# Patient Record
Sex: Female | Born: 1940
Health system: Southern US, Community
[De-identification: ages and names within clinical notes are randomized; demographics above are authoritative.]

## PROBLEM LIST (undated history)

## (undated) DIAGNOSIS — H409 Unspecified glaucoma: Secondary | ICD-10-CM

## (undated) DIAGNOSIS — I471 Supraventricular tachycardia, unspecified: Secondary | ICD-10-CM

## (undated) DIAGNOSIS — M419 Scoliosis, unspecified: Secondary | ICD-10-CM

## (undated) DIAGNOSIS — Z8719 Personal history of other diseases of the digestive system: Secondary | ICD-10-CM

## (undated) DIAGNOSIS — R928 Other abnormal and inconclusive findings on diagnostic imaging of breast: Secondary | ICD-10-CM

## (undated) DIAGNOSIS — K219 Gastro-esophageal reflux disease without esophagitis: Secondary | ICD-10-CM

## (undated) DIAGNOSIS — E785 Hyperlipidemia, unspecified: Secondary | ICD-10-CM

## (undated) DIAGNOSIS — E538 Deficiency of other specified B group vitamins: Secondary | ICD-10-CM

## (undated) DIAGNOSIS — L570 Actinic keratosis: Secondary | ICD-10-CM

## (undated) DIAGNOSIS — D259 Leiomyoma of uterus, unspecified: Secondary | ICD-10-CM

## (undated) DIAGNOSIS — J309 Allergic rhinitis, unspecified: Secondary | ICD-10-CM

## (undated) DIAGNOSIS — I2510 Atherosclerotic heart disease of native coronary artery without angina pectoris: Secondary | ICD-10-CM

## (undated) DIAGNOSIS — R7303 Prediabetes: Secondary | ICD-10-CM

## (undated) DIAGNOSIS — I10 Essential (primary) hypertension: Secondary | ICD-10-CM

## (undated) DIAGNOSIS — K5790 Diverticulosis of intestine, part unspecified, without perforation or abscess without bleeding: Secondary | ICD-10-CM

## (undated) DIAGNOSIS — R7989 Other specified abnormal findings of blood chemistry: Secondary | ICD-10-CM

## (undated) DIAGNOSIS — I499 Cardiac arrhythmia, unspecified: Secondary | ICD-10-CM

## (undated) DIAGNOSIS — E039 Hypothyroidism, unspecified: Secondary | ICD-10-CM

## (undated) DIAGNOSIS — E669 Obesity, unspecified: Secondary | ICD-10-CM

## (undated) DIAGNOSIS — M7660 Achilles tendinitis, unspecified leg: Secondary | ICD-10-CM

## (undated) DIAGNOSIS — Z9889 Other specified postprocedural states: Secondary | ICD-10-CM

## (undated) DIAGNOSIS — I700 Atherosclerosis of aorta: Secondary | ICD-10-CM

## (undated) DIAGNOSIS — I351 Nonrheumatic aortic (valve) insufficiency: Secondary | ICD-10-CM

## (undated) DIAGNOSIS — D649 Anemia, unspecified: Secondary | ICD-10-CM

## (undated) DIAGNOSIS — M1990 Unspecified osteoarthritis, unspecified site: Secondary | ICD-10-CM

## (undated) DIAGNOSIS — E0590 Thyrotoxicosis, unspecified without thyrotoxic crisis or storm: Secondary | ICD-10-CM

## (undated) DIAGNOSIS — K4021 Bilateral inguinal hernia, without obstruction or gangrene, recurrent: Secondary | ICD-10-CM

## (undated) DIAGNOSIS — I493 Ventricular premature depolarization: Secondary | ICD-10-CM

## (undated) HISTORY — PX: INGUINAL HERNIA REPAIR: SUR1180

## (undated) HISTORY — DX: Thyrotoxicosis, unspecified without thyrotoxic crisis or storm: E05.90

## (undated) HISTORY — DX: Gastro-esophageal reflux disease without esophagitis: K21.9

## (undated) HISTORY — DX: Essential (primary) hypertension: I10

## (undated) HISTORY — PX: VARICOSE VEIN SURGERY: SHX832

## (undated) HISTORY — DX: Hyperlipidemia, unspecified: E78.5

## (undated) HISTORY — DX: Unspecified osteoarthritis, unspecified site: M19.90

## (undated) HISTORY — DX: Actinic keratosis: L57.0

## (undated) HISTORY — PX: TUBAL LIGATION: SHX77

## (undated) HISTORY — DX: Other specified abnormal findings of blood chemistry: R79.89

## (undated) HISTORY — DX: Allergic rhinitis, unspecified: J30.9

## (undated) HISTORY — PX: COLONOSCOPY: SHX174

## (undated) HISTORY — DX: Unspecified glaucoma: H40.9

## (undated) HISTORY — DX: Obesity, unspecified: E66.9

## (undated) HISTORY — DX: Achilles tendinitis, unspecified leg: M76.60

## (undated) HISTORY — DX: Anemia, unspecified: D64.9

## (undated) HISTORY — PX: ESOPHAGOGASTRODUODENOSCOPY: SHX1529

## (undated) HISTORY — PX: LOBECTOMY: SHX5089

## (undated) HISTORY — PX: KNEE ARTHROSCOPY: SUR90

## (undated) HISTORY — DX: Other abnormal and inconclusive findings on diagnostic imaging of breast: R92.8

---

## 1989-09-03 HISTORY — PX: THYROIDECTOMY: SHX17

## 1999-04-22 ENCOUNTER — Ambulatory Visit (HOSPITAL_COMMUNITY): Admission: RE | Admit: 1999-04-22 | Discharge: 1999-04-22 | Payer: Self-pay | Admitting: Neurosurgery

## 1999-05-10 ENCOUNTER — Encounter: Admission: RE | Admit: 1999-05-10 | Discharge: 1999-05-15 | Payer: Self-pay | Admitting: Neurosurgery

## 1999-07-21 ENCOUNTER — Other Ambulatory Visit: Admission: RE | Admit: 1999-07-21 | Discharge: 1999-07-21 | Payer: Self-pay | Admitting: Obstetrics and Gynecology

## 1999-11-04 ENCOUNTER — Emergency Department (HOSPITAL_COMMUNITY): Admission: EM | Admit: 1999-11-04 | Discharge: 1999-11-04 | Payer: Self-pay | Admitting: Emergency Medicine

## 1999-11-05 ENCOUNTER — Encounter: Payer: Self-pay | Admitting: Emergency Medicine

## 2000-07-23 ENCOUNTER — Encounter: Payer: Self-pay | Admitting: Internal Medicine

## 2001-02-10 ENCOUNTER — Other Ambulatory Visit: Admission: RE | Admit: 2001-02-10 | Discharge: 2001-02-10 | Payer: Self-pay | Admitting: Obstetrics and Gynecology

## 2002-05-17 ENCOUNTER — Emergency Department (HOSPITAL_COMMUNITY): Admission: EM | Admit: 2002-05-17 | Discharge: 2002-05-17 | Payer: Self-pay | Admitting: Emergency Medicine

## 2002-05-28 ENCOUNTER — Other Ambulatory Visit: Admission: RE | Admit: 2002-05-28 | Discharge: 2002-05-28 | Payer: Self-pay | Admitting: Obstetrics and Gynecology

## 2003-09-20 ENCOUNTER — Other Ambulatory Visit: Admission: RE | Admit: 2003-09-20 | Discharge: 2003-09-20 | Payer: Self-pay | Admitting: Pediatric Nephrology

## 2004-10-12 ENCOUNTER — Other Ambulatory Visit: Admission: RE | Admit: 2004-10-12 | Discharge: 2004-10-12 | Payer: Self-pay | Admitting: Obstetrics and Gynecology

## 2005-03-30 ENCOUNTER — Observation Stay (HOSPITAL_COMMUNITY): Admission: RE | Admit: 2005-03-30 | Discharge: 2005-03-31 | Payer: Self-pay

## 2005-03-30 ENCOUNTER — Encounter (INDEPENDENT_AMBULATORY_CARE_PROVIDER_SITE_OTHER): Payer: Self-pay | Admitting: Specialist

## 2005-07-05 ENCOUNTER — Encounter: Payer: Self-pay | Admitting: Internal Medicine

## 2005-07-10 ENCOUNTER — Ambulatory Visit: Payer: Self-pay | Admitting: Internal Medicine

## 2005-07-24 ENCOUNTER — Ambulatory Visit: Payer: Self-pay | Admitting: Internal Medicine

## 2005-07-24 ENCOUNTER — Encounter (INDEPENDENT_AMBULATORY_CARE_PROVIDER_SITE_OTHER): Payer: Self-pay | Admitting: Specialist

## 2006-07-29 ENCOUNTER — Ambulatory Visit: Payer: Self-pay | Admitting: Internal Medicine

## 2006-08-30 ENCOUNTER — Ambulatory Visit: Payer: Self-pay | Admitting: Internal Medicine

## 2006-09-12 ENCOUNTER — Ambulatory Visit: Payer: Self-pay | Admitting: Internal Medicine

## 2006-09-12 ENCOUNTER — Encounter (INDEPENDENT_AMBULATORY_CARE_PROVIDER_SITE_OTHER): Payer: Self-pay | Admitting: *Deleted

## 2007-03-06 ENCOUNTER — Encounter: Payer: Self-pay | Admitting: Cardiovascular Disease

## 2007-07-23 ENCOUNTER — Other Ambulatory Visit: Admission: RE | Admit: 2007-07-23 | Discharge: 2007-07-23 | Payer: Self-pay | Admitting: Family Medicine

## 2010-05-24 ENCOUNTER — Encounter (INDEPENDENT_AMBULATORY_CARE_PROVIDER_SITE_OTHER): Payer: Self-pay | Admitting: *Deleted

## 2010-09-11 ENCOUNTER — Observation Stay (HOSPITAL_COMMUNITY)
Admission: EM | Admit: 2010-09-11 | Discharge: 2010-09-12 | Payer: Self-pay | Source: Home / Self Care | Attending: Internal Medicine | Admitting: Internal Medicine

## 2010-09-15 ENCOUNTER — Encounter: Payer: Self-pay | Admitting: Internal Medicine

## 2010-09-15 ENCOUNTER — Telehealth: Payer: Self-pay | Admitting: Internal Medicine

## 2010-09-18 ENCOUNTER — Encounter (INDEPENDENT_AMBULATORY_CARE_PROVIDER_SITE_OTHER): Payer: Self-pay | Admitting: *Deleted

## 2010-09-18 LAB — CK TOTAL AND CKMB (NOT AT ARMC)
CK, MB: 0.7 ng/mL (ref 0.3–4.0)
Relative Index: INVALID (ref 0.0–2.5)
Total CK: 38 U/L (ref 7–177)

## 2010-09-18 LAB — POCT I-STAT, CHEM 8
BUN: 24 mg/dL — ABNORMAL HIGH (ref 6–23)
Calcium, Ion: 0.9 mmol/L — ABNORMAL LOW (ref 1.12–1.32)
Chloride: 107 mEq/L (ref 96–112)
Creatinine, Ser: 0.8 mg/dL (ref 0.4–1.2)
Glucose, Bld: 104 mg/dL — ABNORMAL HIGH (ref 70–99)
HCT: 43 % (ref 36.0–46.0)
Hemoglobin: 14.6 g/dL (ref 12.0–15.0)
Potassium: 7.2 mEq/L (ref 3.5–5.1)
Sodium: 134 mEq/L — ABNORMAL LOW (ref 135–145)
TCO2: 29 mmol/L (ref 0–100)

## 2010-09-18 LAB — CARDIAC PANEL(CRET KIN+CKTOT+MB+TROPI)
CK, MB: 0.7 ng/mL (ref 0.3–4.0)
CK, MB: 0.7 ng/mL (ref 0.3–4.0)
Relative Index: INVALID (ref 0.0–2.5)
Relative Index: INVALID (ref 0.0–2.5)
Total CK: 29 U/L (ref 7–177)
Total CK: 34 U/L (ref 7–177)
Troponin I: 0.01 ng/mL (ref 0.00–0.06)
Troponin I: 0.01 ng/mL (ref 0.00–0.06)

## 2010-09-18 LAB — DIFFERENTIAL
Basophils Absolute: 0 10*3/uL (ref 0.0–0.1)
Basophils Relative: 0 % (ref 0–1)
Eosinophils Absolute: 0 10*3/uL (ref 0.0–0.7)
Eosinophils Relative: 0 % (ref 0–5)
Lymphocytes Relative: 12 % (ref 12–46)
Lymphs Abs: 1.3 10*3/uL (ref 0.7–4.0)
Monocytes Absolute: 0.7 10*3/uL (ref 0.1–1.0)
Monocytes Relative: 6 % (ref 3–12)
Neutro Abs: 9.1 10*3/uL — ABNORMAL HIGH (ref 1.7–7.7)
Neutrophils Relative %: 83 % — ABNORMAL HIGH (ref 43–77)

## 2010-09-18 LAB — URINALYSIS, ROUTINE W REFLEX MICROSCOPIC
Bilirubin Urine: NEGATIVE
Hgb urine dipstick: NEGATIVE
Ketones, ur: NEGATIVE mg/dL
Nitrite: NEGATIVE
Protein, ur: NEGATIVE mg/dL
Specific Gravity, Urine: 1.013 (ref 1.005–1.030)
Urine Glucose, Fasting: NEGATIVE mg/dL
Urobilinogen, UA: 0.2 mg/dL (ref 0.0–1.0)
pH: 6 (ref 5.0–8.0)

## 2010-09-18 LAB — CBC
HCT: 41.3 % (ref 36.0–46.0)
Hemoglobin: 13.6 g/dL (ref 12.0–15.0)
MCH: 28.3 pg (ref 26.0–34.0)
MCHC: 32.9 g/dL (ref 30.0–36.0)
MCV: 85.9 fL (ref 78.0–100.0)
Platelets: 363 10*3/uL (ref 150–400)
RBC: 4.81 MIL/uL (ref 3.87–5.11)
RDW: 16.2 % — ABNORMAL HIGH (ref 11.5–15.5)
WBC: 11.1 10*3/uL — ABNORMAL HIGH (ref 4.0–10.5)

## 2010-09-18 LAB — POCT CARDIAC MARKERS
CKMB, poc: <1 ng/mL — ABNORMAL LOW (ref 1.0–8.0)
CKMB, poc: <1 ng/mL — ABNORMAL LOW (ref 1.0–8.0)
Myoglobin, poc: 67.3 ng/mL (ref 12–200)
Myoglobin, poc: 38.4 ng/mL (ref 12–200)
Troponin i, poc: <0.05 ng/mL (ref 0.00–0.09)
Troponin i, poc: <0.05 ng/mL (ref 0.00–0.09)

## 2010-09-18 LAB — BASIC METABOLIC PANEL
BUN: 18 mg/dL (ref 6–23)
CO2: 27 mEq/L (ref 19–32)
Calcium: 8.7 mg/dL (ref 8.4–10.5)
Chloride: 104 mEq/L (ref 96–112)
Creatinine, Ser: 0.84 mg/dL (ref 0.4–1.2)
GFR calc Af Amer: >60 mL/min (ref >60)
GFR calc non Af Amer: >60 mL/min (ref >60)
Glucose, Bld: 172 mg/dL — ABNORMAL HIGH (ref 70–99)
Potassium: 3.9 mEq/L (ref 3.5–5.1)
Sodium: 140 mEq/L (ref 135–145)

## 2010-09-18 LAB — HEMOGLOBIN A1C
Hgb A1c MFr Bld: 6.5 % — ABNORMAL HIGH (ref <5.7)
Mean Plasma Glucose: 140 mg/dL — ABNORMAL HIGH (ref <117)

## 2010-09-18 LAB — TROPONIN I: Troponin I: 0.01 ng/mL (ref 0.00–0.06)

## 2010-09-18 LAB — TSH: TSH: 3.702 u[IU]/mL (ref 0.350–4.500)

## 2010-09-18 LAB — POTASSIUM: Potassium: 3.8 mEq/L (ref 3.5–5.1)

## 2010-09-19 ENCOUNTER — Ambulatory Visit
Admission: RE | Admit: 2010-09-19 | Discharge: 2010-09-19 | Payer: Self-pay | Source: Home / Self Care | Attending: Internal Medicine | Admitting: Internal Medicine

## 2010-09-20 ENCOUNTER — Telehealth (INDEPENDENT_AMBULATORY_CARE_PROVIDER_SITE_OTHER): Payer: Self-pay | Admitting: Radiology

## 2010-09-21 ENCOUNTER — Encounter: Payer: Self-pay | Admitting: Internal Medicine

## 2010-09-21 ENCOUNTER — Ambulatory Visit: Admission: RE | Admit: 2010-09-21 | Discharge: 2010-09-21 | Payer: Self-pay | Source: Home / Self Care

## 2010-09-21 ENCOUNTER — Encounter (HOSPITAL_COMMUNITY)
Admission: RE | Admit: 2010-09-21 | Discharge: 2010-10-03 | Payer: Self-pay | Source: Home / Self Care | Attending: Cardiovascular Disease | Admitting: Cardiovascular Disease

## 2010-09-25 ENCOUNTER — Telehealth (INDEPENDENT_AMBULATORY_CARE_PROVIDER_SITE_OTHER): Payer: Self-pay | Admitting: *Deleted

## 2010-09-26 ENCOUNTER — Ambulatory Visit: Admit: 2010-09-26 | Payer: Self-pay | Admitting: Internal Medicine

## 2010-09-26 ENCOUNTER — Other Ambulatory Visit: Payer: Self-pay | Admitting: Internal Medicine

## 2010-09-26 ENCOUNTER — Ambulatory Visit
Admission: RE | Admit: 2010-09-26 | Discharge: 2010-09-26 | Payer: Self-pay | Source: Home / Self Care | Attending: Internal Medicine | Admitting: Internal Medicine

## 2010-09-28 NOTE — Discharge Summary (Addendum)
  [name], Gina Ford                ACCOUNT NO.:  [id number]  MEDICAL RECORD NO.:  [id number]          PATIENT TYPE:  OBV  LOCATION:  1444                         FACILITY:  [facility]  PHYSICIAN:  Hind I Elsaid, MD      DATE OF BIRTH:  September 23, 1940  DATE OF ADMISSION:  09/11/2010 DATE OF DISCHARGE:  09/12/2010                              DISCHARGE SUMMARY   PRIMARY CARE PHYSICIAN:  Sigmund Hazel, MD  CARDIOLOGIST:  Vesta Mixer, MD  DISCHARGE DIAGNOSES: 1. Chest pain, myocardial infarction was ruled out for myocardial     stress test as an outpatient. 2. Hypothyroidism. 3. Hypertension. 4. Hyperlipidemia. 5. Arthritis. 6. History of gastroesophageal reflux disease.  PROCEDURES: 1. Chest x-ray, mild chronic bronchitic-type change, but no acute     pulmonary findings. 2. EKG, normal sinus rhythm, possible left atrial enlargement.  HISTORY OF PRESENT ILLNESS:  This is a 70 year old very pleasant female with a history of hypothyroidism and arthritis.  The patient is really active lady.  She will go to the gym almost more than 3 times weekly. She started recently participating on heavy activities.  She presented to the hospital with chest pain and EKG did show normal sinus rhythm. The patient denies any shortness of breath.  The patient was recently started on meloxicam, prednisone, second anti-inflammatory condition over the sole of her feet.  The patient admitted to the hospital for further evaluation of her chest pain.  Cardiac enzymes x3 were negative and EKG did show normal sinus rhythm.  Cardiology consult done by Dr. Kristeen Miss, where the patient was scheduled to have outpatient stress test and followup with them.  The patient on the day of discharge, denies any chest pain, denies any shortness of breath.  The patient was advised to follow up with her primary care physician.  Currently, we felt the patient is medially stable for discharge.  The patient will need to  follow up with her primary care as well as appointment for her stress test.     Hind Bosie Helper, MD     HIE/MEDQ  D:  09/12/2010  T:  09/13/2010  Job:  [id number]  Electronically Signed by Ebony Cargo MD on 09/28/2010 12:44:02 PM

## 2010-10-03 ENCOUNTER — Encounter: Payer: Self-pay | Admitting: Internal Medicine

## 2010-10-03 NOTE — Letter (Signed)
Summary: Colonoscopy Letter  [facility]  [address] [place], [place] [address]   Phone: [phone]  Fax: [phone]      May 24, 2010 MRN: [id number]   [facility] [address] [place], [place]  [address]   Dear Gina Ford,   According to your medical record, it is time for you to schedule a Colonoscopy. The American Cancer Society recommends this procedure as a method to detect early colon cancer. Patients with a family history of colon cancer, or a personal history of colon polyps or inflammatory bowel disease are at increased risk.  This letter has beeen generated based on the recommendations made at the time of your procedure. If you feel that in your particular situation this may no longer apply, please contact our office.  Please call our office at [phone] to schedule this appointment or to update your records at your earliest convenience.  Thank you for cooperating with [place] to provide you with the very best care possible.   Sincerely,  Gina Ford. Juanda Chance, M.D.  [facility] Gastroenterology Division [phone]

## 2010-10-05 NOTE — Progress Notes (Signed)
Summary: nuc pre-procedure  Phone Note Outgoing Call   Call placed by: Domenic Polite, CNMT,  September 20, 2010 11:00 AM Call placed to: Patient Reason for Call: Confirm/change Appt Summary of Call: Reviewed information on Myoview Information Sheet (see scanned document for further details).  Spoke with patient.      Nuclear Med Background Indications for Stress Test: Evaluation for Ischemia, Post Hospital     Symptoms: Chest Pain  Symptoms Comments: CP> "Heaviness" Lt. shoulder   Nuclear Pre-Procedure Cardiac Risk Factors: Family History - CAD, Hypertension, Lipids

## 2010-10-05 NOTE — Procedures (Addendum)
Summary: Colonoscopy  Patient: [name] Note: All result statuses are Final unless otherwise noted.  Tests: (1) Colonoscopy (COL)   COL Colonoscopy           DONE     [facility]     [address]     [place], [place]  [address]           COLONOSCOPY PROCEDURE REPORT           PATIENT:  Gina Ford, Gina Ford  MR#:  [id number]     BIRTHDATE:  1940-10-15, 69 yrs. old  GENDER:  female     ENDOSCOPIST:  Hedwig Morton. Juanda Chance, MD     REF. BY:  Sigmund Hazel, M.D.     PROCEDURE DATE:  09/26/2010     PROCEDURE:  Colonoscopy 21308     ASA CLASS:  Class II     INDICATIONS:  history of pre-cancerous (adenomatous) colon polyps     aden. polyp 1986 and 2006,     MEDICATIONS:   Versed 3 mg, Fentanyl 50 mcg           DESCRIPTION OF PROCEDURE:   After the risks benefits and     alternatives of the procedure were thoroughly explained, informed     consent was obtained.  Digital rectal exam was performed and     revealed no rectal masses.   The LB [id number]-[id number] endoscope     was introduced through the anus and advanced to the cecum, which     was identified by both the appendix and ileocecal valve, without     limitations.  The quality of the prep was good, using MoviPrep.     The instrument was then slowly withdrawn as the colon was fully     examined.     <<PROCEDUREIMAGES>>           FINDINGS:  Moderate diverticulosis was found (see image1, image2,     and image7). narrow left colon with deep diverticuli  A sessile     polyp was found. 3mm polyp at 20 cm The polyp was removed using     cold biopsy forceps (see image8).  This was otherwise a normal     examination of the colon (see image9, image6, image5, image4, and     image3).   Retroflexed views in the rectum revealed no     abnormalities.    The scope was then withdrawn from the patient     and the procedure completed.           COMPLICATIONS:  None     ENDOSCOPIC IMPRESSION:     1) Moderate diverticulosis     2) Sessile polyp     3) Otherwise normal examination     RECOMMENDATIONS:     1) Await pathology results     2) High fiber diet.     REPEAT EXAM:  In 10 year(s) for.           ______________________________     Hedwig Morton. Juanda Chance, MD           CC:           n.     eSIGNED:   Hedwig Morton. Ellaree Gear at 09/26/2010 02:24 PM           Abran Richard, [id number]  Note: An exclamation mark (!) indicates a result that was not dispersed into the flowsheet. Document Creation Date: 09/26/2010 2:24 PM _______________________________________________________________________  (1) Order result status: Final  Collection or observation date-time: 09/26/2010 14:10 Requested date-time:  Receipt date-time:  Reported date-time:  Referring Physician:   Ordering Physician: Lina Sar [phone]) Specimen Source:  Source: Launa Grill Order Number: [id number] Lab site:   Appended Document: Colonoscopy     Procedures Next Due Date:    Colonoscopy: 10/2020

## 2010-10-05 NOTE — Procedures (Signed)
Summary: EGD  [View Reports-CCC] Patient Name: Gina Ford, Gina Ford. MRN:  Procedure Procedures: Panendoscopy (EGD) CPT: 43235.    with biopsy(s)/brushing(s). CPT: D1846139.  Personnel: Endoscopist: Bernell Sigal L. Juanda Chance, MD.  Exam Location: Exam performed in Outpatient Clinic. Outpatient  Patient Consent: Procedure, Alternatives, Risks and Benefits discussed, consent obtained, from patient. Consent was obtained by the RN.  Indications Symptoms: Reflux symptoms for <1 yr,  History  Current Medications: Patient is not currently taking Coumadin.  Pre-Exam Physical: Performed Sep 12, 2006  Entire physical exam was normal.  Comments: Pt. history reviewed/updated, physical exam performed prior to initiation of sedation?yes Exam Exam Info: Maximum depth of insertion Duodenum, intended Duodenum. Vocal cords visualized. Gastric retroflexion performed. Images taken. ASA Classification: I. Tolerance: good.  Sedation Meds: Patient assessed and found to be appropriate for moderate (conscious) sedation. Fentanyl 50 mcg. given IV. Versed 5 mg. given IV. Cetacaine Spray 2 sprays given aerosolized.  Monitoring: BP and pulse monitoring done. Oximetry used. Supplemental O2 given  Findings - HIATAL HERNIA: Diaphragm 39 cm from mouth. Z-line/GE Junction 35 cm from mouth. Regular, 4 cms. in length. Biopsy/Hiatal Hernia taken.  ICD9: Hernia, Hiatal: 553.3.Comments: 3-4 cm nonreducible hiatal hernia, s/p biopsies from the ge junction.  DIAGNOSTIC TEST: from Antrum. RUT done, results pending  Normal: Duodenal Apex.   Assessment Abnormal examination, see findings above.  Diagnoses: 553.3: Hernia, Hiatal.   Comments: moderate size hiatal hernia without stricture or esophagitis, s/p biopsies from the g-e junction Events  Unplanned Intervention: No unplanned interventions were required.  Unplanned Events: There were no complications. Plans Medication(s): PPI: Pantoprazole/Protonix 40 mg QD,  starting Sep 12, 2006   Patient Education: Patient given standard instructions for: Reflux.  Disposition: After procedure patient sent to recovery. After recovery patient sent home.   cc: Al Decant. Janey Greaser, MD

## 2010-10-05 NOTE — Procedures (Signed)
Summary: Colonoscopy  Patient Name: Gina Ford, Sawhney. MRN:  Procedure Procedures: Colonoscopy CPT: [phone].    with biopsy. CPT: Q5068410.  Personnel: Endoscopist: Charlayne Vultaggio L. Juanda Chance, MD.  Exam Location: Exam performed in Outpatient Clinic. Outpatient  Patient Consent: Procedure, Alternatives, Risks and Benefits discussed, consent obtained, from patient. Consent was obtained by the RN.  Indications  Surveillance of: Adenomatous Polyp(s). Initial polypectomy was performed in 1986. Pathology of worst  polyp: tubular adenoma. Previous surveillance exam(s) in  1995, Previous surveillance exam(s) in  2001,  History  Current Medications: Patient is not currently taking Coumadin.  Pre-Exam Physical: Performed Jul 24, 2005. Entire physical exam was normal.  Exam Exam: Extent of exam reached: Cecum, extent intended: Cecum.  The cecum was identified by appendiceal orifice and IC valve. Colon retroflexion performed. Images taken. ASA Classification: I. Tolerance: good.  Monitoring: Pulse and BP monitoring, Oximetry used. Supplemental O2 given.  Colon Prep Used Miralax for colon prep. Prep results: good.  Sedation Meds: Patient assessed and found to be appropriate for moderate (conscious) sedation. Fentanyl 100 mcg. given IV. Versed 10 mg. given IV.  Findings - POLYP: Cecum, Maximum size: 3 mm. sessile polyp. Distance from Anus 110 cm. Procedure:  biopsy without cautery, The polyp was removed piece meal. removed, retrieved, Polyp sent to pathology. ICD9: Colon Polyps: 211.3.  - NORMAL EXAM: Cecum.  - DIVERTICULOSIS: Sigmoid Colon. ICD9: Diverticulosis: 562.10. Comments: moderarely severe left and right colon.  - NORMAL EXAM: Rectum.   Assessment Abnormal examination, see findings above.  Diagnoses: 211.3: Colon Polyps.  562.10: Diverticulosis.   Comments: s/p cecal polypectomy Events  Unplanned Interventions: No intervention was required.  Unplanned Events: There were no  complications. Plans  Post Exam Instructions: No aspirin or non-steroidal containing medications: 2weeks.  Medication Plan: Await pathology.  Patient Education: Patient given standard instructions for: a normal exam. Yearly hemoccult testing recommended. Patient instructed to get routine colonoscopy every 5 years.  Disposition: After procedure patient sent to recovery. After recovery patient sent home.   cc: Al Decant. Janey Greaser, MD  This report was created from the original endoscopy report, which was reviewed and signed by the above listed endoscopist.

## 2010-10-05 NOTE — Letter (Signed)
Summary: Pre Visit Letter Revised  [facility]  [address] [place], [place] [address]   Phone: [phone]  Fax: [phone]        09/15/2010 MRN: [id number] [facility] [place] [address] [place], [place]  [address]             Procedure Date: 09/26/2010  Welcome to the Gastroenterology Division at [facility].    You are scheduled to see a nurse for your pre-procedure visit on 09/19/2010  at  10:30 AM on the 3rd floor at [facility], [address]  We ask that you try to arrive at our office 15 minutes prior to your appointment time to allow for check-in.  Please take a minute to review the attached form.  If you answer "Yes" to one or more of the questions on the first page, we ask that you call the person listed at your earliest opportunity.  If you answer "No" to all of the questions, please complete the rest of the form and bring it to your appointment.    Your nurse visit will consist of discussing your medical and surgical history, your immediate family medical history, and your medications.   If you are unable to list all of your medications on the form, please bring the medication bottles to your appointment and we will list them.  We will need to be aware of both prescribed and over the counter drugs.  We will need to know exact dosage information as well.    Please be prepared to read and sign documents such as consent forms, a financial agreement, and acknowledgement forms.  If necessary, and with your consent, a friend or relative is welcome to sit-in on the nurse visit with you.  Please bring your insurance card so that we may make a copy of it.  If your insurance requires a referral to see a specialist, please bring your referral form from your primary care physician.  No co-pay is required for this nurse visit.     If you cannot keep your appointment, please call [phone] to cancel or reschedule prior to your appointment date.   This allows [place] the opportunity to schedule an appointment for another patient in need of care.    Thank you for choosing [facility] for your medical needs.  We appreciate the opportunity to care for you.  Please visit [place] at our website  to learn more about our practice.  Sincerely, The Gastroenterology Division

## 2010-10-05 NOTE — Procedures (Addendum)
Summary: Upper Endoscopy  Patient: Gina Ford Note: All result statuses are Final unless otherwise noted.  Tests: (1) Upper Endoscopy (EGD)   EGD Upper Endoscopy       DONE     [facility]     [address]     [place], [place]  [address]           ENDOSCOPY PROCEDURE REPORT           PATIENT:  Gina Ford, Gina Ford  MR#:  [id number]     BIRTHDATE:  [birth year], 69 yrs. old  GENDER:  female           ENDOSCOPIST:  Hedwig Morton. Juanda Chance, MD     Referred by:  Sigmund Hazel, M.D.           PROCEDURE DATE:  09/26/2010     PROCEDURE:  EGD with biopsy, 43239     ASA CLASS:  Class II     INDICATIONS:  abdominal pain last EGD 2008-GERD, HHernia,     recent exacerbation while on Mobic           MEDICATIONS:   Versed 4 mg, Fentanyl 25 mcg     TOPICAL ANESTHETIC:  Exactacain Spray           DESCRIPTION OF PROCEDURE:   After the risks benefits and     alternatives of the procedure were thoroughly explained, informed     consent was obtained.  The [facility] [id number] [id number] endoscope was     introduced through the mouth and advanced to the second portion of     the duodenum, without limitations.  The instrument was slowly     withdrawn as the mucosa was fully examined.     <<PROCEDUREIMAGES>>           Esophagitis was found in the distal esophagus. 1cm long linear     eroosiond within g-e junction, no stricture With standard forceps,     a biopsy was obtained and sent to pathology (see image1, image2,     image3, and image4).  A hiatal hernia was found (see image8 and     image7).  Otherwise the examination was normal (see image5 and     image6).    Retroflexed views revealed no abnormalities.    The     scope was then withdrawn from the patient and the procedure     completed.           COMPLICATIONS:  None           ENDOSCOPIC IMPRESSION:     1) Esophagitis in the distal esophagus     2) Hiatal hernia     3) Otherwise normal examination     RECOMMENDATIONS:     1) Await biopsy results  Prelosec 40mg , #30, 1 po qd, 1 refill     hold Mobic     Cafafate slurry 8 of, 2 tsp po bid, 1 refill,           REPEAT EXAM:  In 0 year(s) for.           ______________________________     Hedwig Morton. Juanda Chance, MD           CC:           n.     eSIGNED:   Hedwig Morton. Clevester Helzer at 09/26/2010 02:18 PM           Abran Richard, [id number]  Note: An exclamation mark (!) indicates a result  that was not dispersed into the flowsheet. Document Creation Date: 09/26/2010 2:19 PM _______________________________________________________________________  (1) Order result status: Final Collection or observation date-time: 09/26/2010 13:45 Requested date-time:  Receipt date-time:  Reported date-time:  Referring Physician:   Ordering Physician: Lina Sar [phone]) Specimen Source:  Source: Launa Grill Order Number: [id number] Lab site:

## 2010-10-05 NOTE — Assessment & Plan Note (Signed)
Summary: Cardiology Nuclear Testing  Nuclear Med Background Indications for Stress Test: Evaluation for Ischemia, Post Hospital   History: GXT  History Comments: '08 GXT:OK per patient  Symptoms: Chest Pressure, DOE, Palpitations  Symptoms Comments: CP>(L) shoulder. Last episode of ZO:[id number].   Nuclear Pre-Procedure Cardiac Risk Factors: Family History - CAD, Hypertension, Lipids Caffeine/Decaff Intake: None NPO After: 7:30 PM Lungs: Clear.   IV 0.9% NS with Angio Cath: 22g     IV Site: R Antecubital IV Started by: Irean Hong, RN Chest Size (in) 34     Cup Size B     Height (in): 63 Weight (lb): 148 BMI: 26.31  Nuclear Med Study 1 or 2 day study:  1 day     Stress Test Type:  Stress Reading MD:  Dietrich Pates, MD     Referring MD:  Kristeen Miss, MD Resting Radionuclide:  Technetium 67m Tetrofosmin     Resting Radionuclide Dose:  11.0 mCi  Stress Radionuclide:  Technetium 23m Tetrofosmin     Stress Radionuclide Dose:  33.0 mCi   Stress Protocol Exercise Time (min):  7:16 min     Max HR:  137 bpm     Predicted Max HR:  152 bpm  Max Systolic BP: 194 mm Hg     Percent Max HR:  90.13 %     METS: 8.1 Rate Pressure Product:  98119    Stress Test Technologist:  Rea [employer], CMA-N     Nuclear Technologist:  Domenic Polite, CNMT  Rest Procedure  Myocardial perfusion imaging was performed at rest 45 minutes following the intravenous administration of Technetium 18m Tetrofosmin.  Stress Procedure  The patient exercised for 7:16 utilizing the Bruce protocol.  The patient stopped due to fatigue and denied any chest pain.  There were no diagnostic ST-T wave changes.  Technetium 55m Tetrofosmin was injected at peak exercise and myocardial perfusion imaging was performed after a brief delay.  QPS Raw Data Images:  Soft tissue (dipahragm, breast) surround heart. Stress Images:  Normal homogeneous uptake in all areas of the myocardium. Rest Images:  Normal homogeneous uptake in  all areas of the myocardium. Subtraction (SDS):  No evidence of ischemia. Transient Ischemic Dilatation:  1.07  (Normal <1.22)  Lung/Heart Ratio:  0.25  (Normal <0.45)  Quantitative Gated Spect Images QGS EDV:  79 ml QGS ESV:  30 ml QGS EF:  61 %   Overall Impression  Exercise Capacity: Good exercise capacity. BP Response: Normal blood pressure response. Clinical Symptoms: No chest pain ECG Impression: No significant ST segment change suggestive of ischemia. Overall Impression: Normal stress nuclear study.  Appended Document: Cardiology Nuclear Testing copy sent to Dr. Melburn Popper

## 2010-10-05 NOTE — Letter (Signed)
Summary: [facility] Instructions  [facility]  [address] [place], [place] [address]   Phone: [phone]  Fax: [phone]       Gina Ford    [birth year]    MRN: [id number]        Procedure Day /Date:  Tuesday 09/26/2010     Arrival Time: 12:30 pm     Procedure Time:  1:30 pm     Location of Procedure:                    _ x_   Endoscopy Center (4th Floor)                        PREPARATION FOR COLONOSCOPY WITH MOVIPREP   Starting 5 days prior to your procedure Thursday 1/19 do not eat nuts, seeds, popcorn, corn, beans, peas,  salads, or any raw vegetables.  Do not take any fiber supplements (e.g. Metamucil, Citrucel, and Benefiber).  THE DAY BEFORE YOUR PROCEDURE         DATE: Monday 1/23  1.  Drink clear liquids the entire day-NO SOLID FOOD  2.  Do not drink anything colored red or purple.  Avoid juices with pulp.  No orange juice.  3.  Drink at least 64 oz. (8 glasses) of fluid/clear liquids during the day to prevent dehydration and help the prep work efficiently.  CLEAR LIQUIDS INCLUDE: Water Jello Ice Popsicles Tea (sugar ok, no milk/cream) Powdered fruit flavored drinks Coffee (sugar ok, no milk/cream) Gatorade Juice: apple, white grape, white cranberry  Lemonade Clear bullion, consomm, broth Carbonated beverages (any kind) Strained chicken noodle soup Hard Candy                             4.  In the morning, mix first dose of MoviPrep solution:    Empty 1 Pouch A and 1 Pouch B into the disposable container    Add lukewarm drinking water to the top line of the container. Mix to dissolve    Refrigerate (mixed solution should be used within 24 hrs)  5.  Begin drinking the prep at 5:00 p.m. The MoviPrep container is divided by 4 marks.   Every 15 minutes drink the solution down to the next mark (approximately 8 oz) until the full liter is complete.   6.  Follow completed prep with 16 oz of clear liquid of your choice (Nothing  red or purple).  Continue to drink clear liquids until bedtime.  7.  Before going to bed, mix second dose of MoviPrep solution:    Empty 1 Pouch A and 1 Pouch B into the disposable container    Add lukewarm drinking water to the top line of the container. Mix to dissolve    Refrigerate  THE DAY OF YOUR PROCEDURE      DATE: Tuesday 1/24  Beginning at 8:30 a.m. (5 hours before procedure):         1. Every 15 minutes, drink the solution down to the next mark (approx 8 oz) until the full liter is complete.  2. Follow completed prep with 16 oz. of clear liquid of your choice.    3. You may drink clear liquids until 11:30 am (2 HOURS BEFORE PROCEDURE).   MEDICATION INSTRUCTIONS  Unless otherwise instructed, you should take regular prescription medications with a small sip of water   as early as possible the morning of  your procedure.  Additional medication instructions: Do not take HCTZ day of procedure.         OTHER INSTRUCTIONS  You will need a responsible adult at least 69 years of age to accompany you and drive you home.   This person must remain in the waiting room during your procedure.  Wear loose fitting clothing that is easily removed.  Leave jewelry and other valuables at home.  However, you may wish to bring a book to read or  an iPod/MP3 player to listen to music as you wait for your procedure to start.  Remove all body piercing jewelry and leave at home.  Total time from sign-in until discharge is approximately 2-3 hours.  You should go home directly after your procedure and rest.  You can resume normal activities the  day after your procedure.  The day of your procedure you should not:   Drive   Make legal decisions   Operate machinery   Drink alcohol   Return to work  You will receive specific instructions about eating, activities and medications before you leave.    The above instructions have been reviewed and explained to me by   Ezra Sites RN  September 19, 2010 10:48 AM    I fully understand and can verbalize these instructions _____________________________ Date _________

## 2010-10-05 NOTE — Progress Notes (Signed)
Summary: triage  Phone Note Call from Patient Call back at Home Phone [phone] Call back at cell [phone]   Caller: Patient Call For: Dr Juanda Chance Reason for Call: Talk to Nurse Summary of Call: Patient has a lot of pain on her left side x 3 months but has gotten worse, would like to see Dr Juanda Chance asap. Initial call taken by: Tawni Levy,  September 15, 2010 1:16 PM  Follow-up for Phone Call        Spoke with patient. She has had pain in the left lower abdomen for the last 3 months but it is getting more frequent. The pain comes and goes. She has noticed it hurts to "press on the area" Denies constipation, diarrhea, nausea, vomiting or bleeding. She has been under a lot of stress. States she recently sold her home and moved and currently she is helping her sister who has leukemia. She will be going with her sister to [facility] in about 3 weeks for 2 months when her sister is getting treatment. Patient is scheduled for stress test for chest pain on 09/21/10. Last colon was 11/2/6- polyps and diverticulosis. She was due for colon 9/11 but did not schedule. Do you want to see her first or just schedule colon? Please, advise. Follow-up by: Jesse Fall RN,  September 15, 2010 2:38 PM  Additional Follow-up for Phone Call Additional follow up Details #1::        As far as the colon is concerned, she may be a direct. If she wants to talok about her pain at lenght she will need an OV. We can do the colon first and see what we find a treat her for that. Additional Follow-up by: Hart Carwin MD,  September 15, 2010 2:48 PM    Additional Follow-up for Phone Call Additional follow up Details #2::    Spoke with patient re: Dr. Regino Schultze recommendations. Scheduled patient for direct colon on 09/26/10 at 1:30 PM with 12:30 PM arrival. Previsit scheduled on 09/19/10 at 10:30 AM. Letter mailed. Follow-up by: Jesse Fall RN,  September 15, 2010 4:12 PM

## 2010-10-05 NOTE — Miscellaneous (Signed)
Summary: [facility] PV  Clinical Lists Changes  Medications: Added new medication of MOVIPREP 100 GM  SOLR (PEG-KCL-NACL-NASULF-NA ASC-C) As per prep instructions. - Signed Rx of MOVIPREP 100 GM  SOLR (PEG-KCL-NACL-NASULF-NA ASC-C) As per prep instructions.;  #1 x 0;  Signed;  Entered by: Ezra Sites RN;  Authorized by: Hart Carwin MD;  Method used: [facility] to [facility]  [phone]*, [address], [place], [place]  [address], Ph: [phone] or [phone], Fax: [phone] Allergies: Added new allergy or adverse reaction of CARDURA Added new allergy or adverse reaction of * UNIVASC Observations: Added new observation of NKA: F (09/19/2010 10:29)    Prescriptions: MOVIPREP 100 GM  SOLR (PEG-KCL-NACL-NASULF-NA ASC-C) As per prep instructions.  #1 x 0   Entered by:   Ezra Sites RN   Authorized by:   Hart Carwin MD   Signed by:   Ezra Sites RN on 09/19/2010   Method used:   Electronically to        [facility]  [phone]* (retail)       [address] [place], [place]  [address]       Ph: [phone] or [phone]       Fax: [phone]   RxID:   [id number]

## 2010-10-05 NOTE — Miscellaneous (Signed)
Summary: prilosec-rx  Clinical Lists Changes  Medications: Added new medication of PRILOSEC 40 MG  CPDR (OMEPRAZOLE) 1 each day 30 minutes before meal - Signed Added new medication of CARAFATE 1 GM/10ML  SUSP (SUCRALFATE) take 2tsp by mouth bid - Signed Rx of PRILOSEC 40 MG  CPDR (OMEPRAZOLE) 1 each day 30 minutes before meal;  #30 x 1;  Signed;  Entered by: Greer Ee RN;  Authorized by: Hart Carwin MD;  Method used: Electronically to [facility]  [phone]*, [address], [place], [place]  [address], Ph: [phone] or [phone], Fax: [phone] Rx of CARAFATE 1 GM/10ML  SUSP (SUCRALFATE) take 2tsp by mouth bid;  #8 oz x 1;  Signed;  Entered by: Greer Ee RN;  Authorized by: Hart Carwin MD;  Method used: Electronically to [facility]  [phone]*, [address], [place], [place]  [address], Ph: [phone] or [phone], Fax: [phone]    Prescriptions: CARAFATE 1 GM/10ML  SUSP (SUCRALFATE) take 2tsp by mouth bid  #8 oz x 1   Entered by:   Greer Ee RN   Authorized by:   Hart Carwin MD   Signed by:   Greer Ee RN on 09/26/2010   Method used:   Electronically to        [facility]  [phone]* (retail)       [address] [place], [place]  [address]       Ph: [phone] or [phone]       Fax: [phone]   RxID:   [id number] PRILOSEC 40 MG  CPDR (OMEPRAZOLE) 1 each day 30 minutes before meal  #30 x 1   Entered by:   Greer Ee RN   Authorized by:   Hart Carwin MD   Signed by:   Greer Ee RN on 09/26/2010   Method used:   Electronically to        [facility]  [phone]* (retail)       [address] [place], [place]  [address]       Ph: [phone] or [phone]       Fax: [phone]   RxID:   [id number]

## 2010-10-05 NOTE — Procedures (Signed)
Summary: Colonscopy  Patient Name: Gina Ford, Gina Ford. MRN:  Procedure Procedures: Colonoscopy CPT: [phone].  Personnel: Endoscopist: Robertha Staples L. Juanda Chance, MD.  Referred By: Al Decant Janey Greaser, MD.  Exam Location: Exam performed in Outpatient Clinic. Outpatient  Patient Consent: Procedure, Alternatives, Risks and Benefits discussed, consent obtained, from patient.  Indications  Surveillance of: Adenomatous Polyp(s). Initial polypectomy was performed in 1986. in Feb. 1-2 Polyps were found at Index Exam.  Increased Risk Screening: For family history of colorectal neoplasia, in  parent  History  Pre-Exam Physical: Performed Jul 23, 2000. Cardio-pulmonary exam, Rectal exam, HEENT exam , Abdominal exam, Extremity exam, Neurological exam, Mental status exam WNL.  Exam Exam: Extent of exam reached: Cecum, extent intended: Cecum.  Colon retroflexion performed. Images taken. ASA Classification: II. Tolerance: excellent.  Monitoring: Pulse and BP monitoring, Oximetry used. Supplemental O2 given.  Colon Prep Used Phospho Soda for colon prep. Prep results: fair.  Sedation Meds: Fentanyl 100 mcg. Versed 10 mg.  Findings - DIVERTICULOSIS: Cecum to Sigmoid Colon. Not bleeding. ICD9: Diverticulosis: 562.10.   Assessment Abnormal examination, see findings above.  Diagnoses: 562.10: Diverticulosis.   Events  Unplanned Interventions: No intervention was required.  Unplanned Events: There were no complications. Plans  Post Exam Instructions: Resume previous diet: high fiber.  Medication Plan: Continue current medications. Fiber supplements: Methylcellulose 1 Tbsp QD, starting Jul 23, 2000   Patient Education: Patient given standard instructions for: Diverticulosis. Patient instructed to get routine colonoscopy every 5 years.  Disposition: After procedure patient sent to recovery. After recovery patient sent home.  Scheduling/Referral: Follow-Up prn.   cc: Al Decant. Janey Greaser,  MD  This report was created from the original endoscopy report, which was reviewed and signed by the above listed endoscopist.

## 2010-10-11 NOTE — Letter (Signed)
Summary: Patient Notice-Endo Biopsy Results  [facility]  [address] [place], [place] [address]   Phone: [phone]  Fax: [phone]        October 03, 2010 MRN: [id number]    [facility] [place] [address] [place], [place]  [address]    Dear Ms. Gina Ford,  I am pleased to inform you that the biopsies taken during your recent endoscopic examination did not show any evidence of cancer upon pathologic examination.Biopsies show severe inflammation due to reflux  Additional information/recommendations:  __No further action is needed at this time.  Please follow-up with      your primary care physician for your other healthcare needs.  __ Please call [phone] to schedule a return visit to review      your condition.  _x_ Continue with the treatment plan as outlined on the day of your      exam.  __   Please call us if you are having persistent problems or have questions about your condition that have not been fully answered at this time.  Sincerely,  Hart Carwin MD  This letter has been electronically signed by your physician.  Appended Document: Patient Notice-Endo Biopsy Results LETTER MAILED

## 2010-10-11 NOTE — Letter (Signed)
Summary: Patient Notice- Polyp Results  [facility]  [address] [place], [place] [address]   Phone: [phone]  Fax: [phone]        October 03, 2010 MRN: [id number]    [facility] [place] [address] [place], [place]  [address]    Dear Ms. Vickey Sages,  I am pleased to inform you that the colon polyp(s) removed during your recent colonoscopy was (were) found to be benign (no cancer detected) upon pathologic examination.  I recommend you have a repeat colonoscopy examination in 10 _ years to look for recurrent polyps, as having colon polyps increases your risk for having recurrent polyps or even colon cancer in the future.  Should you develop new or worsening symptoms of abdominal pain, bowel habit changes or bleeding from the rectum or bowels, please schedule an evaluation with either your primary care physician or with me.  Additional information/recommendations:  _x_ No further action with gastroenterology is needed at this time. Please      follow-up with your primary care physician for your other healthcare      needs.  __ Please call [phone] to schedule a return visit to review your      situation.  __ Please keep your follow-up visit as already scheduled.  __ Continue treatment plan as outlined the day of your exam.  Please call us if you are having persistent problems or have questions about your condition that have not been fully answered at this time.  Sincerely,  Hart Carwin MD  This letter has been electronically signed by your physician.  Appended Document: Patient Notice- Polyp Results LETTER MAILED

## 2010-12-05 NOTE — Consult Note (Signed)
NAMESOFIAH, LYNE NO.:  [id number]  MEDICAL RECORD NO.:  [id number]          PATIENT TYPE:  EMS  LOCATION:  ED                           FACILITY:  [facility]  PHYSICIAN:  Marca Ancona, MD      DATE OF BIRTH:  1941/08/31  DATE OF CONSULTATION:  09/11/2010 DATE OF DISCHARGE:                                CONSULTATION   PRIMARY CARDIOLOGY:  Vesta Mixer, M.D.  PRIMARY CARE PHYSICIAN:  Sigmund Hazel, M.D., [facility].  PATIENT PROFILE:  The patient is a 70 year old female with prior history of hypertension, hyperlipidemia who presents to the [facility] ED with complaints of chest pain which has since resolved.  PROBLEM LIST: 1. Chest pain without objective evidence of ischemia.     a.     History of normal Myoview in 2008 by patient report. 2. Hypertension times x20-30 years. 3. Hyperlipidemia x20 years. 4. GERD. 5. Osteoarthritis. 6. Hypothyroidism. 7. 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 8. Question tendonitis of the left foot, currently on prednisone taper     and Mobic therapy for the past 3 days. 9. Status post ventral hernia repair. 10.Status post arthroscopy of the right knee.  ALLERGIES:  CARDURA and UNIVASC.  HISTORY OF PRESENT ILLNESS:  The patient is a 70 year old female with the above problem list.  She did have prior history of chest pain with negative Myoview in 2008 by her report.  She just got back into exercise routine starting this morning and went to her local gym where she did cardio exercises and some stretching for about 30 to 45 minutes.  She was able to do this without any significant limitations.  Later on this morning, she was out shopping, had sudden onset of 7 out of 10 substernal chest pressure and heaviness with radiation to the left shoulder and upper back without associated symptoms.  She got back to her car and drove home and then called her husband.  Her  husband went home to pick her up and bring her to the ED.  Her symptoms resolved spontaneously within about 20 minutes of onset.  Here, she has been pain- free.  Enzymes negative and ECG is nonacute.  HOME MEDICATIONS: 1. HCTZ 12.5 mg daily. 2. Norvasc 5 mg daily. 3. Pravachol 40 mg daily. 4. Synthroid 100 mcg daily. 5. For the past 3 days, she has also been taking prednisone 10 mg     tapering from 60 mg down, she is due 40 mg day. 6. Meloxicam 15 mg daily. 7. Voltaren gel 4 g q.i.d. to affected area.  FAMILY HISTORY:  Mother died of a stroke at 45.  Father died at age 21, he had an MI in his 20s.  SOCIAL HISTORY:  The patient lives in [place] with her husband.  She is retired.  Previously owned a Public relations account executive.  She has 2 grown children.  She denies tobacco or drug use and occasionally has a glass of wine.  She is trying to get back into a regular exercise regimen starting  today.  REVIEW OF SYSTEMS:  Positive for chest pain and foot pain.  Otherwise all systems reviewed and negative.  CODE STATUS:  She is a full code.  PHYSICAL EXAMINATION:  VITAL SIGNS:  Temperature 98.2, heart rate 72, respirations 16, blood pressure 166/82, pulse ox 98% on room air. GENERAL:  A pleasant white female, in no acute distress.  Awake, alert, oriented x3.  She has normal affect. HEENT:  Normal nares.  Grossly intact and nonfocal. SKIN:  War and dry without lesions or masses. NECK:  Supple without bruits, JVD. LUNGS:  Respirations are unlabored. CARDIAC:  Regular S1, S2.  No S3, S4, murmurs. ABDOMEN:  Round, soft, nontender.  Bowel sounds present x4. EXTREMITIES:  Warm, dry, pink.  No clubbing, cyanosis or edema. Dorsalis pedis, posterior tibial pulses 2+ and equal bilaterally.  LABORATORY DATA:  ECG shows sinus rhythm at a rate of 74 beats per minute, no acute ST-T changes.  Chest x-ray shows chronic bronchitic changes without acute findings.  Lab work; hemoglobin 13.6,  hematocrit 41.3, WBC 11.1, platelets 363.  Urinalysis negative.  Sodium 134, potassium 3.8, chloride 107, CO2 of 29, BUN 24, creatinine 0.8.  Glucose 104.  Point-of-care cardiac markers are negative x2.  ASSESSMENT: 1. Chest pain without objective evidence of ischemia.  The patient had     episode of chest pain earlier today lasting about 20 minutes,     resolving spontaneously.  ECG is without acute changes and so far     enzymes are negative.  I agree with admission and continuation of     aspirin and home blood pressure medications.  Provided that enzymes     are negative, the patient is likely okay for discharge in the     morning.  Plan for outpatient Myoview which we can arrange in an     office.  If enzymes return positive or the patient has recurrent     chest pain, would consider catheterization. 2. Hypertension.  Blood pressure is currently up in the ER.  Resume     home medications and follow. 3. Hyperlipidemia.  Continue statin therapy.  Followup in outpatient     setting. 4. Foot pain/information.  Steroids and Mobic are new in the past 3     days.  Question contribution of symptoms.  If enzymes remain     negative, consider GI source.  PPI has been ordered. 5. Hypothyroidism.  Continue home dose of Synthroid.     Nicolasa Ducking, ANP   ______________________________ Marca Ancona, MD    CB/MEDQ  D:  09/11/2010  T:  09/11/2010  Job:  [id number]  Electronically Signed by Nicolasa Ducking ANP on 11/07/2010 03:57:03 PM Electronically Signed by Marca Ancona MD on 11/09/2010 09:08:30 AM

## 2011-01-19 NOTE — Assessment & Plan Note (Signed)
HEALTHCARE                           GASTROENTEROLOGY OFFICE NOTE   DALANI, METTE                       MRN:          [id number]  DATE:07/29/2006                            DOB:          10/05/1940    Ms. Gina Ford is a very nice 70 year old white female who comes for evaluation  of burning substernally and indigestion consistent with gastroesophageal  reflux.  This has been occurring progressively over the past year.  Every  time she bends over or coughs, she has some reflux and burning sensation in  the back of her throat.  She was put on what sounds like Prevacid by Dr.  Janey Greaser.  She took her samples and had felt some improvement but not a  complete resolution.  She denies taking NSAIDs or aspirin.  She does not  smoke.  She has gained some weight in recent years.  She denies dysphagia or  odynophagia.  Patient has a history of occasional reflux, once in a while  for many years, for which she has taken TUMs or Rolaids.   We had seen [name] in the past for colorectal screening.  She has  diverticulosis of the left colon and had a colonoscopy 1986 with finding of  another polyp during the last colonoscopy November 2006.  She had a right  thyroid lobectomy by Dr. Orson Slick in July 2006.   MEDICATIONS:  1. Lipitor 10 mg p.o. q.d.  2. Diovan HCT 80/12.5. one p.o. q.d.  3. Synthroid 88 mcg q.d.   PAST MEDICAL HISTORY:  Significant for high blood pressure, high  cholesterol, thyroid problems, arthritis.   OPERATION:  Thyroidectomy.   FAMILY HISTORY:  Heart disease.  No family history of colon cancer.   SOCIAL HISTORY:  Married to children.  She works part-time.  Patient does  not smoke, drinks alcohol very rarely.   REVIEW OF SYSTEMS:  Mild weight gain, eyeglasses, arthritic complaints,  sleeping problems, vision changes.   PHYSICAL EXAMINATION:  VITAL SIGNS:  Blood pressure 120/70, pulse 76 and  weight 156 pounds.  GENERAL:  She was  alert, oriented, in no distress.  Her voice was somewhat  deeper than usual, since the thyroidectomy.  HEENT:  Sclerae is nonicteric.  No exophthalmus.  LUNGS:  Clear to auscultation.  COR:  Normal S1, normal S2.  ABDOMEN:  Soft, nontender with normoactive bowel sounds.  There was minimal  discomfort in the left lower quadrant but no fullness or mass.  RECTAL:  Soft, hemoccult-negative stools.   IMPRESSION:  77. A 70 year old white female with new onset of gastroesophageal reflux,      with a history of occasional reflux treated with over the counter      antacids.  It is not clear as to the etiology of sudden exacerbation,      possible mild weight gain, possibly partial thyroidectomy might have      affected the motility of the esophagus and possibly affected the acid      clearance from her esophagus.  2. Patient may be also developing hiatal hernia. R/o gastroparesis, or  gastric retention causing secondary reflux  3. History of colon polyps.  4. Colonoscopy would be due in November 2009.   PLAN:  1. Upper endoscopy scheduled.  2. She will begin Protonix 40 mg daily.  3. Anti-reflux measures.  We will plan to do biopsy of the esophagus to      rule out Barrett's esophagus.     Gina Ford. Gina Chance, MD  Electronically Signed    DMB/MedQ  DD: 07/29/2006  DT: 07/29/2006  Job #: [id number]

## 2011-01-19 NOTE — Op Note (Signed)
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   SURGEON:  Dr. Orson Slick.   ASSISTANT:  Dr. Luisa Hart   ANESTHESIA:  General.   PROCEDURE:  After the patient was monitored and anesthetized and had routine  preparation and draping of the anterior and lateral neck, I made an incision  through the previous thyroidectomy incision.  The patient had had a left  thyroid lobectomy years ago, and it was benign.  I dissected down through  the fat and platysma and then raised skin and subcutaneous and platysma  flaps cephalad to the thyroid cartilage and caudad to the sternal notch and  laterally to the sternocleidomastoid muscles.  I then incised the midline  fascia.  I noted a clip on the extent of the resection of the left lobe, and  some of the isthmus was visible, and there was also a small pyramidal lobe.  Dissecting in the plane between the strap muscles and anterior surface of  the thyroid gland, I dissected laterally.  I found a sizable middle thyroid  vein and ligated that in continuity with 2-0 silk and divided it.  I then  worked toward the superior pole and working from anterior to posterior,  approached and ligated the vessels using silk ligatures and clips as  appropriate.  That provided me good mobility of the superior pole, and the  nodule itself was toward the superior pole.  I then worked along the lateral  surface of the thyroid gland segmentally medializing it with blunt  dissection, cautery,  and ligating vessels with clips and ligatures as  needed.  I then found and ligated and divided the inferior thyroid artery  and several veins associated with it.  I found the inferior parathyroid  gland and separated it from the thyroid gland and kept it unharmed.  As I  medialized the gland and worked back into the tracheoesophageal groove, I  found the recurrent laryngeal nerve and dissected just anterior to it,  preserving it, and saw it as it entered the larynx.  I then dissected medial  to it, lifting the thyroid gland out of the tracheoesophageal groove and off  the trachea, completing the resection.  I sent the specimen to the  pathologist in formalin since there was no evidence of any cervical  lymphadenopathy and since the left lobe had been removed.  After getting  good hemostasis, I packed a little Surgicel into the dissected area.  I felt  hemostasis was excellent.  I closed the midline with running 3-0 Vicryl  and closed platysma with running 3-0  Vicryl then closed the skin with  interrupted buried intracuticular 4-0 Vicryl reinforced by Steri-Strips.  The patient tolerated the operation well, and there were no complications.  Blood loss was minimal.  She went to PACU in good condition.       WB/MEDQ  D:  03/30/2005  T:  03/30/2005  Job:  [id number]

## 2012-05-08 ENCOUNTER — Ambulatory Visit: Payer: Self-pay | Admitting: Cardiovascular Disease

## 2012-06-04 ENCOUNTER — Encounter: Payer: Self-pay | Admitting: Cardiovascular Disease

## 2012-11-25 ENCOUNTER — Encounter: Payer: Self-pay | Admitting: Cardiovascular Disease

## 2014-06-09 ENCOUNTER — Encounter: Payer: Self-pay | Admitting: Cardiovascular Disease

## 2014-12-17 ENCOUNTER — Ambulatory Visit
Admission: RE | Admit: 2014-12-17 | Discharge: 2014-12-17 | Disposition: A | Discharge status: Home / Self Care | Payer: Medicare Other | Source: Ambulatory Visit | Attending: Family Medicine | Admitting: Family Medicine

## 2014-12-17 ENCOUNTER — Other Ambulatory Visit: Payer: Self-pay | Admitting: Family Medicine

## 2014-12-17 DIAGNOSIS — M545 Low back pain: Secondary | ICD-10-CM

## 2015-01-02 DIAGNOSIS — R7989 Other specified abnormal findings of blood chemistry: Secondary | ICD-10-CM

## 2015-01-02 HISTORY — DX: Other specified abnormal findings of blood chemistry: R79.89

## 2015-03-04 ENCOUNTER — Ambulatory Visit
Admission: RE | Admit: 2015-03-04 | Discharge: 2015-03-04 | Disposition: A | Discharge status: Home / Self Care | Payer: Medicare Other | Source: Ambulatory Visit | Attending: Physician Assistant | Admitting: Physician Assistant

## 2015-03-04 ENCOUNTER — Other Ambulatory Visit: Payer: Self-pay | Admitting: Physician Assistant

## 2015-03-04 DIAGNOSIS — M5410 Radiculopathy, site unspecified: Secondary | ICD-10-CM

## 2015-05-13 ENCOUNTER — Other Ambulatory Visit: Payer: Self-pay | Admitting: Gastroenterology

## 2015-09-28 DIAGNOSIS — H401111 Primary open-angle glaucoma, right eye, mild stage: Secondary | ICD-10-CM | POA: Diagnosis not present

## 2015-09-28 DIAGNOSIS — H401121 Primary open-angle glaucoma, left eye, mild stage: Secondary | ICD-10-CM | POA: Insufficient documentation

## 2015-11-25 ENCOUNTER — Ambulatory Visit
Admission: RE | Admit: 2015-11-25 | Discharge: 2015-11-25 | Disposition: A | Discharge status: Home / Self Care | Payer: PPO | Source: Ambulatory Visit | Attending: Family Medicine | Admitting: Family Medicine

## 2015-11-25 ENCOUNTER — Other Ambulatory Visit: Payer: Self-pay | Admitting: Family Medicine

## 2015-11-25 DIAGNOSIS — R918 Other nonspecific abnormal finding of lung field: Secondary | ICD-10-CM | POA: Diagnosis not present

## 2015-11-25 DIAGNOSIS — R222 Localized swelling, mass and lump, trunk: Secondary | ICD-10-CM

## 2015-12-29 DIAGNOSIS — D2271 Melanocytic nevi of right lower limb, including hip: Secondary | ICD-10-CM | POA: Diagnosis not present

## 2015-12-29 DIAGNOSIS — D225 Melanocytic nevi of trunk: Secondary | ICD-10-CM | POA: Diagnosis not present

## 2015-12-29 DIAGNOSIS — L814 Other melanin hyperpigmentation: Secondary | ICD-10-CM | POA: Diagnosis not present

## 2015-12-29 DIAGNOSIS — D2272 Melanocytic nevi of left lower limb, including hip: Secondary | ICD-10-CM | POA: Diagnosis not present

## 2015-12-29 DIAGNOSIS — D1801 Hemangioma of skin and subcutaneous tissue: Secondary | ICD-10-CM | POA: Diagnosis not present

## 2015-12-29 DIAGNOSIS — D2262 Melanocytic nevi of left upper limb, including shoulder: Secondary | ICD-10-CM | POA: Diagnosis not present

## 2015-12-29 DIAGNOSIS — L821 Other seborrheic keratosis: Secondary | ICD-10-CM | POA: Diagnosis not present

## 2015-12-29 DIAGNOSIS — D2261 Melanocytic nevi of right upper limb, including shoulder: Secondary | ICD-10-CM | POA: Diagnosis not present

## 2016-01-24 ENCOUNTER — Encounter: Payer: Self-pay | Admitting: Internal Medicine

## 2016-01-24 DIAGNOSIS — H401122 Primary open-angle glaucoma, left eye, moderate stage: Secondary | ICD-10-CM | POA: Diagnosis not present

## 2016-01-24 DIAGNOSIS — H2513 Age-related nuclear cataract, bilateral: Secondary | ICD-10-CM | POA: Diagnosis not present

## 2016-01-24 DIAGNOSIS — H524 Presbyopia: Secondary | ICD-10-CM | POA: Diagnosis not present

## 2016-01-24 DIAGNOSIS — H401111 Primary open-angle glaucoma, right eye, mild stage: Secondary | ICD-10-CM | POA: Diagnosis not present

## 2016-02-01 IMAGING — CR DG CERVICAL SPINE 2 OR 3 VIEWS
4 series · 4 of 4 positions shown · non-contrast
Comparison: None.

CLINICAL DATA: Right arm radiculopathy for 1 month.

EXAM:
CERVICAL SPINE - 2-3 VIEW

[w cervical spine lat]
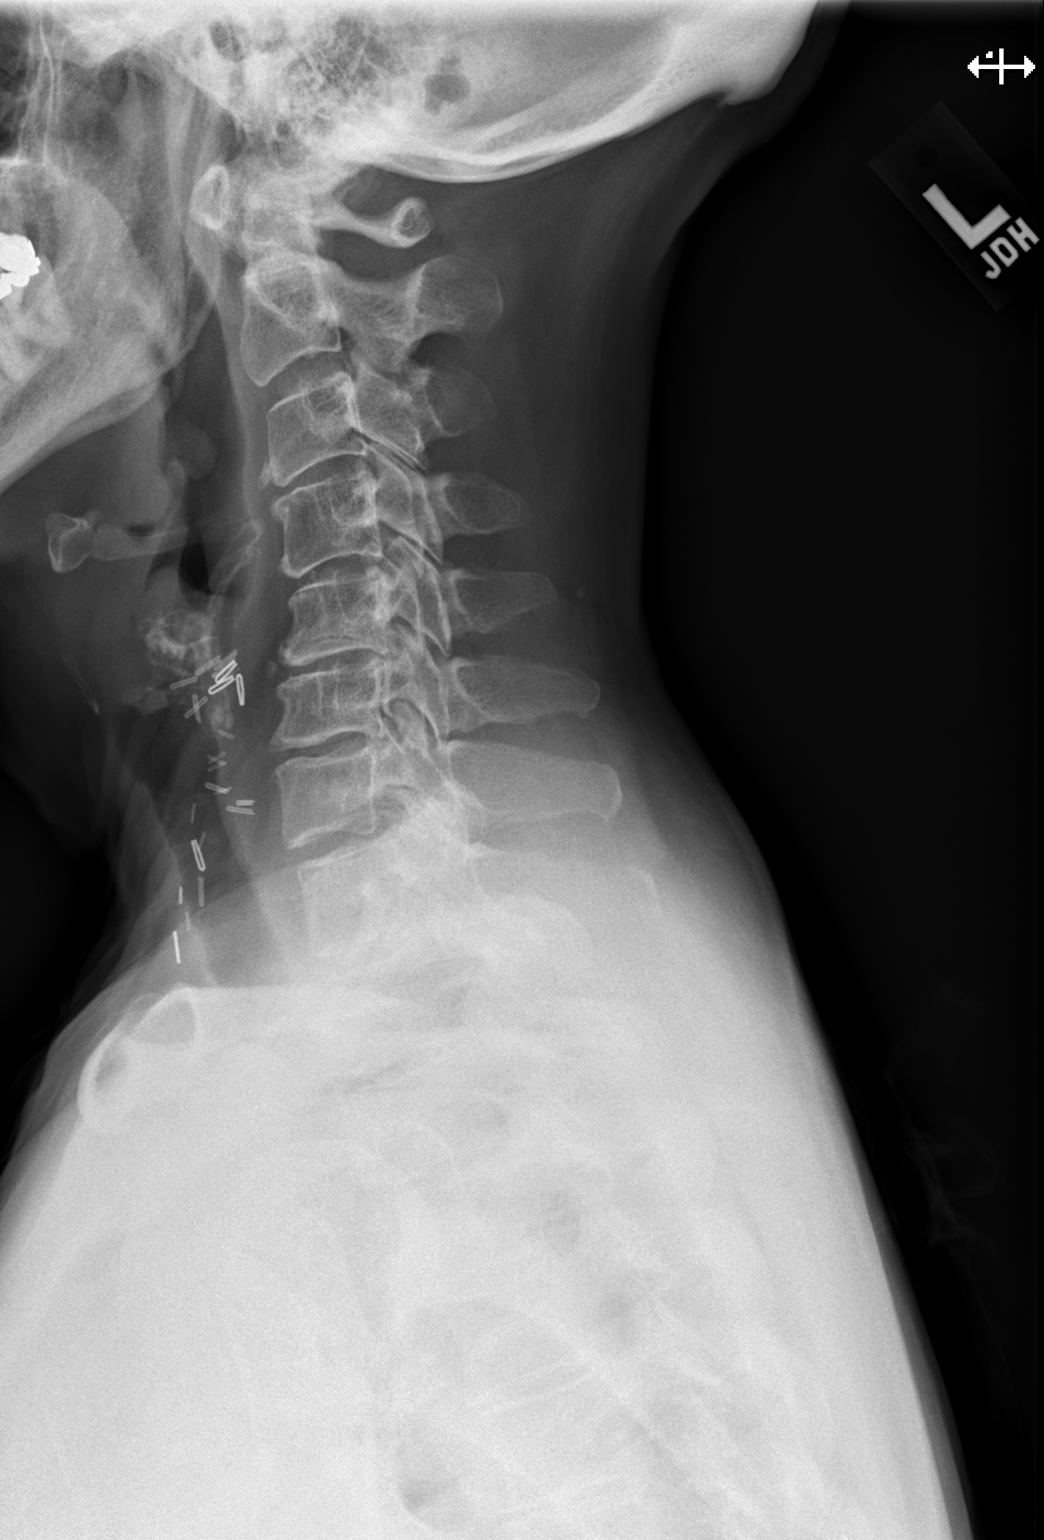

[w cervical spine ap]
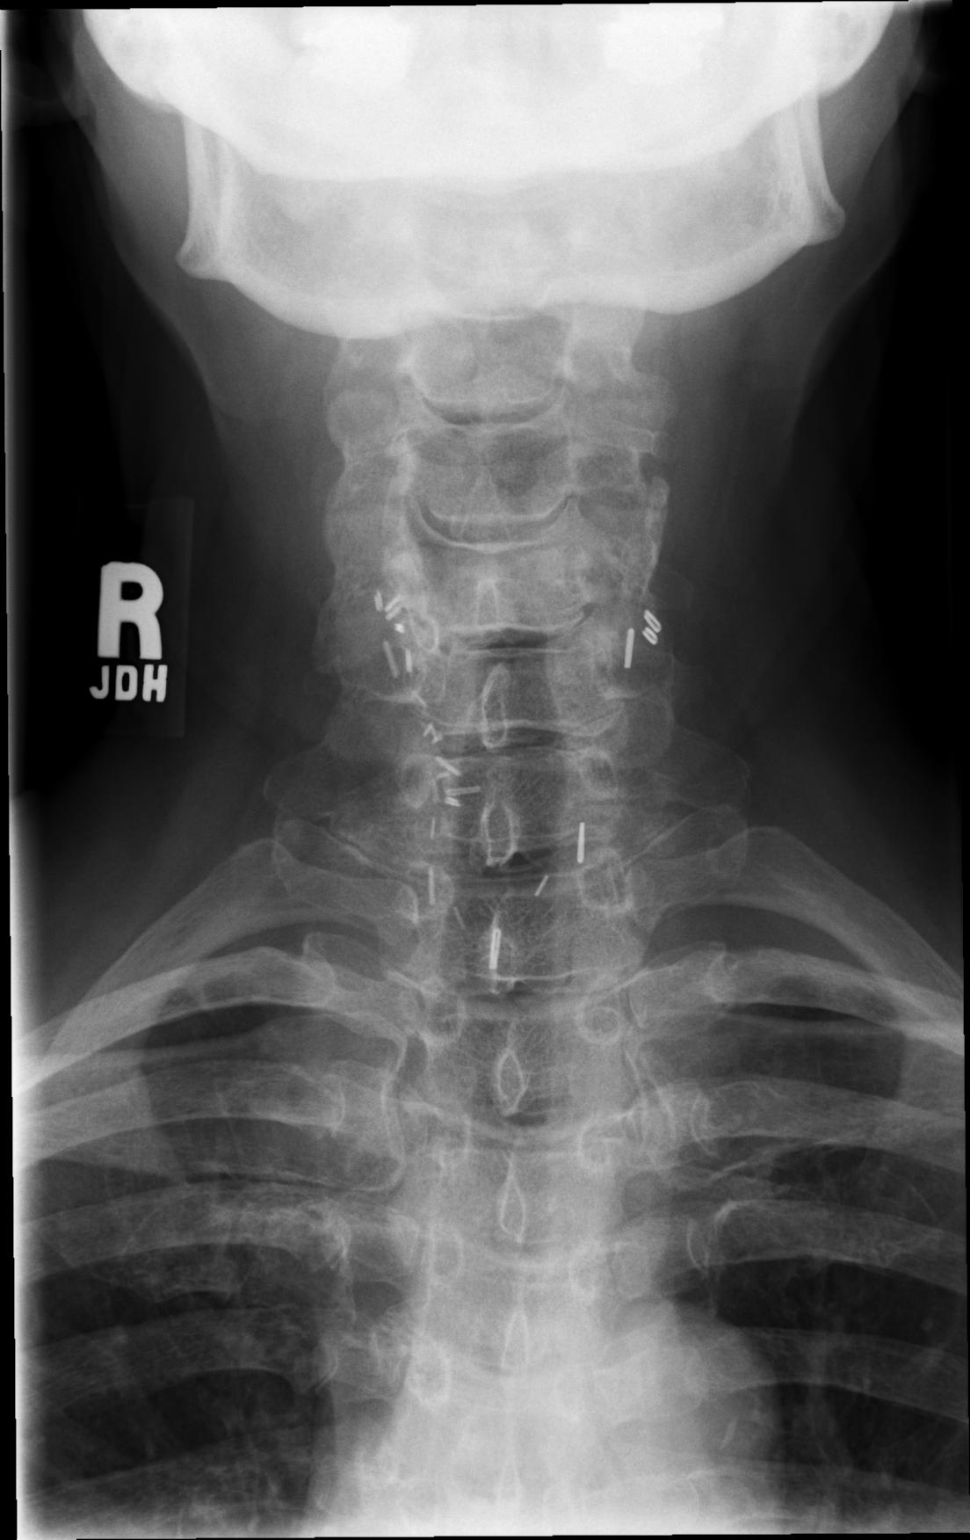

[t cervical spine odontoid (1 of 2)]
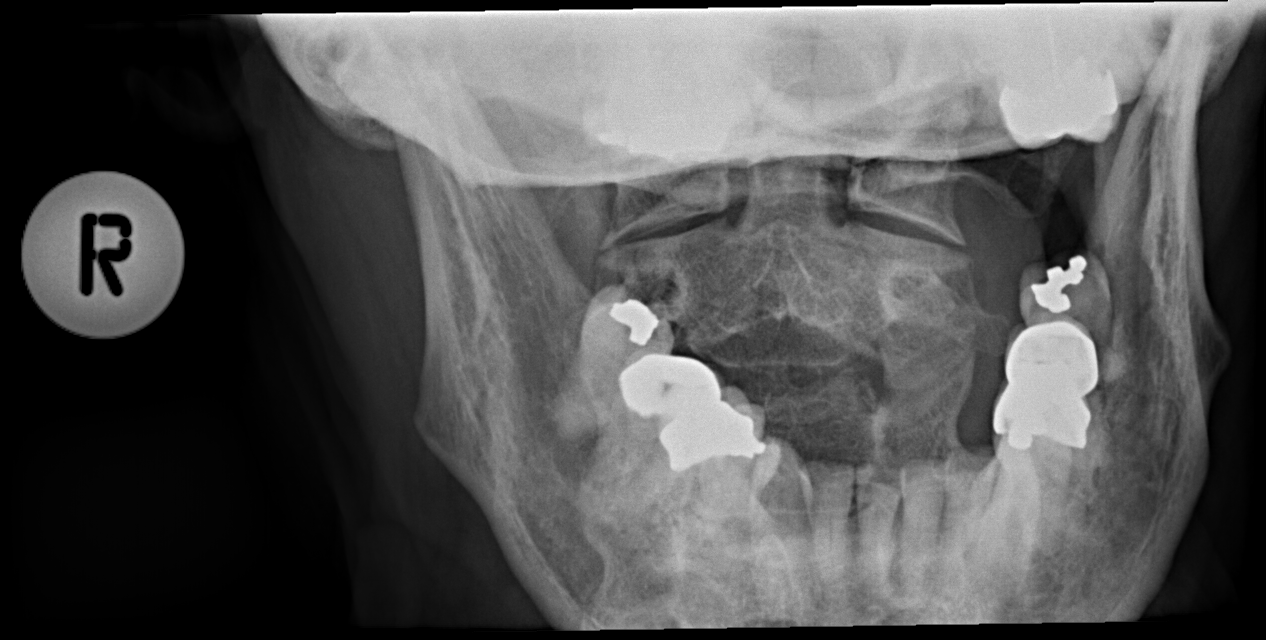

[t cervical spine odontoid (2 of 2)]
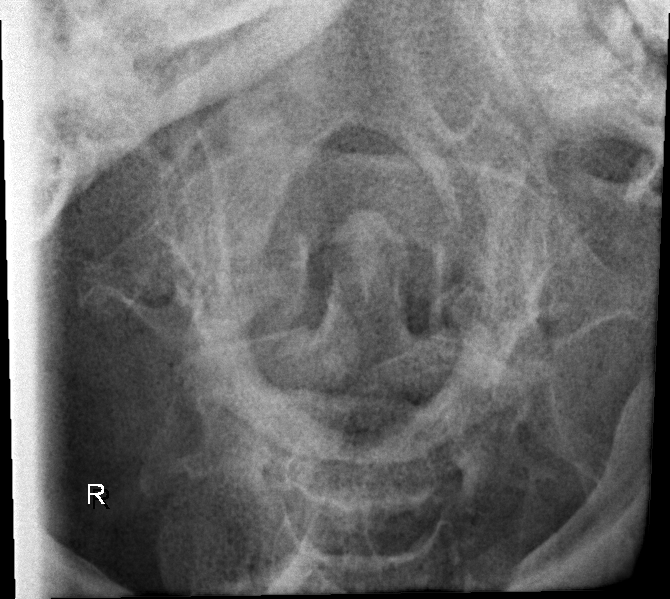

[4 of 4 positions shown; findings below may reference images not displayed]

FINDINGS: Reversal of normal lordosis of cervical spine is noted which may be
positional in origin. No fracture is noted. Minimal grade 1
retrolisthesis of C5-6 is noted secondary to degenerative disc
disease at this level. Mild degenerative disc disease is also noted
at C3-4, C4-5 and C5-6.
IMPRESSION: Multilevel degenerative disc disease is noted. No acute abnormality
seen in the cervical spine.

## 2016-02-08 DIAGNOSIS — E7800 Pure hypercholesterolemia, unspecified: Secondary | ICD-10-CM | POA: Diagnosis not present

## 2016-02-08 DIAGNOSIS — E559 Vitamin D deficiency, unspecified: Secondary | ICD-10-CM | POA: Diagnosis not present

## 2016-02-08 DIAGNOSIS — M8580 Other specified disorders of bone density and structure, unspecified site: Secondary | ICD-10-CM | POA: Diagnosis not present

## 2016-02-08 DIAGNOSIS — E039 Hypothyroidism, unspecified: Secondary | ICD-10-CM | POA: Diagnosis not present

## 2016-02-08 DIAGNOSIS — H409 Unspecified glaucoma: Secondary | ICD-10-CM | POA: Diagnosis not present

## 2016-02-08 DIAGNOSIS — Z0000 Encounter for general adult medical examination without abnormal findings: Secondary | ICD-10-CM | POA: Diagnosis not present

## 2016-02-08 DIAGNOSIS — M7062 Trochanteric bursitis, left hip: Secondary | ICD-10-CM | POA: Diagnosis not present

## 2016-02-08 DIAGNOSIS — I10 Essential (primary) hypertension: Secondary | ICD-10-CM | POA: Diagnosis not present

## 2016-04-05 DIAGNOSIS — M545 Low back pain: Secondary | ICD-10-CM | POA: Diagnosis not present

## 2016-04-19 DIAGNOSIS — R55 Syncope and collapse: Secondary | ICD-10-CM | POA: Diagnosis not present

## 2016-04-19 DIAGNOSIS — D509 Iron deficiency anemia, unspecified: Secondary | ICD-10-CM | POA: Diagnosis not present

## 2016-04-19 DIAGNOSIS — R002 Palpitations: Secondary | ICD-10-CM | POA: Diagnosis not present

## 2016-04-19 DIAGNOSIS — Z862 Personal history of diseases of the blood and blood-forming organs and certain disorders involving the immune mechanism: Secondary | ICD-10-CM | POA: Diagnosis not present

## 2016-05-15 ENCOUNTER — Encounter: Payer: Self-pay | Admitting: Cardiology

## 2016-05-21 DIAGNOSIS — D509 Iron deficiency anemia, unspecified: Secondary | ICD-10-CM | POA: Diagnosis not present

## 2016-05-28 DIAGNOSIS — R42 Dizziness and giddiness: Secondary | ICD-10-CM | POA: Diagnosis not present

## 2016-05-28 DIAGNOSIS — H401111 Primary open-angle glaucoma, right eye, mild stage: Secondary | ICD-10-CM | POA: Diagnosis not present

## 2016-05-28 DIAGNOSIS — R002 Palpitations: Secondary | ICD-10-CM | POA: Diagnosis not present

## 2016-05-28 DIAGNOSIS — H401122 Primary open-angle glaucoma, left eye, moderate stage: Secondary | ICD-10-CM | POA: Diagnosis not present

## 2016-05-28 DIAGNOSIS — H2513 Age-related nuclear cataract, bilateral: Secondary | ICD-10-CM | POA: Diagnosis not present

## 2016-05-28 DIAGNOSIS — R55 Syncope and collapse: Secondary | ICD-10-CM | POA: Diagnosis not present

## 2016-05-29 ENCOUNTER — Ambulatory Visit: Payer: PPO | Admitting: Cardiology

## 2016-06-03 DIAGNOSIS — L03211 Cellulitis of face: Secondary | ICD-10-CM | POA: Diagnosis not present

## 2016-06-13 DIAGNOSIS — Z23 Encounter for immunization: Secondary | ICD-10-CM | POA: Diagnosis not present

## 2016-06-13 DIAGNOSIS — D509 Iron deficiency anemia, unspecified: Secondary | ICD-10-CM | POA: Diagnosis not present

## 2016-07-07 DIAGNOSIS — S0012XA Contusion of left eyelid and periocular area, initial encounter: Secondary | ICD-10-CM | POA: Insufficient documentation

## 2016-07-07 DIAGNOSIS — Y929 Unspecified place or not applicable: Secondary | ICD-10-CM | POA: Insufficient documentation

## 2016-07-07 DIAGNOSIS — Z79899 Other long term (current) drug therapy: Secondary | ICD-10-CM | POA: Diagnosis not present

## 2016-07-07 DIAGNOSIS — X58XXXA Exposure to other specified factors, initial encounter: Secondary | ICD-10-CM | POA: Insufficient documentation

## 2016-07-07 DIAGNOSIS — I10 Essential (primary) hypertension: Secondary | ICD-10-CM | POA: Insufficient documentation

## 2016-07-07 DIAGNOSIS — Y999 Unspecified external cause status: Secondary | ICD-10-CM | POA: Diagnosis not present

## 2016-07-07 DIAGNOSIS — Y939 Activity, unspecified: Secondary | ICD-10-CM | POA: Diagnosis not present

## 2016-07-07 DIAGNOSIS — S0512XA Contusion of eyeball and orbital tissues, left eye, initial encounter: Secondary | ICD-10-CM | POA: Diagnosis not present

## 2016-07-08 ENCOUNTER — Emergency Department (HOSPITAL_COMMUNITY)
Admission: EM | Admit: 2016-07-08 | Discharge: 2016-07-08 | Disposition: A | Discharge status: Home / Self Care | Payer: PPO | Attending: Emergency Medicine | Admitting: Emergency Medicine

## 2016-07-08 ENCOUNTER — Encounter (HOSPITAL_COMMUNITY): Payer: Self-pay | Admitting: Emergency Medicine

## 2016-07-08 DIAGNOSIS — S0512XA Contusion of eyeball and orbital tissues, left eye, initial encounter: Secondary | ICD-10-CM

## 2016-07-08 MED ORDER — TETRACAINE HCL 0.5 % OP SOLN
2.0000 [drp] | Freq: Once | OPHTHALMIC | Status: AC
  Administered 2016-07-08: 2 [drp] via OPHTHALMIC
  Filled 2016-07-08: qty 4

## 2016-07-08 MED ORDER — FLUORESCEIN SODIUM 1 MG OP STRP
2.0000 | ORAL_STRIP | Freq: Once | OPHTHALMIC | Status: AC
Start: 2016-07-08 — End: 2016-07-08
  Administered 2016-07-08: 2 via OPHTHALMIC
  Filled 2016-07-08: qty 2

## 2016-07-08 NOTE — ED Provider Notes (Signed)
[facility] DEPT Provider Note   CSN: [id number] Arrival date & time: 07/07/16  2352  By signing my name below, I, Gwenlyn Fudge, attest that this documentation has been prepared under the direction and in the presence of [employer], [name]. Electronically Signed: Gwenlyn Fudge, ED Scribe. 07/08/16. 12:27 AM.  History   Chief Complaint Chief Complaint  Patient presents with  . Eye Injury   The history is provided by the patient. No language interpreter was used.   HPI Comments: Gina Ford is a 75 y.o. female with PMHx of HLD and HTN who presents to the Emergency Department complaining of sudden onset, dark red bruising below the left eye onset 1 hour PTA. 3 weeks ago, she had an infection to her left eyebrow that spread to the surrounding area to the left eye. She received an antibiotic injection and medication to take at home. She denies pain to area, visual changes, headaches, sneezing, vomiting, coughing, fever, chills, nausea, recent trauma, eye redness, eye pain, eye discharge, acute neck stiffness. She is not on blood thinners or daily Aspirin. She denies hx of easy bruising.  She took 2 Tylenol for arthritis earlier today.  Past Medical History:  Diagnosis Date  . Abnormal mammogram    NEED R DIAGNO MAMMO 3/12  . Achilles tendonitis    B 2ND AND 3RD MTP SYNOVITIS DR. Beola Cord  . Actinic keratosis    HAND 6/11 KELLIE SHEFFIELD, PA  . Allergic rhinitis   . Anemia   . Arthritis   . GERD (gastroesophageal reflux disease)   . Glaucoma   . HLD (hyperlipidemia)   . HTN (hypertension)   . Hyperthyroidism   . Low serum vitamin D 01/2015  . Obesity    There are no active problems to display for this patient.  Past Surgical History:  Procedure Laterality Date  . COLONOSCOPY    . ESOPHAGOGASTRODUODENOSCOPY  2012, 2016  . INGUINAL HERNIA REPAIR Left   . KNEE ARTHROSCOPY    . LOBECTOMY     Right Thyroid, then Left  . THYROIDECTOMY  1991  . TUBAL LIGATION    .  VARICOSE VEIN SURGERY      OB History    No data available       Home Medications    Prior to Admission medications   Medication Sig Start Date End Date Taking? Authorizing Provider  amLODipine (NORVASC) 5 MG tablet Take 5 mg by mouth daily.    Historical Provider, MD  hydrochlorothiazide (HYDRODIURIL) 25 MG tablet Take 12.5 mg by mouth daily.    Historical Provider, MD  hydrocortisone (ANUSOL-HC) 2.5 % rectal cream Place 1 application rectally 2 (two) times daily as needed for hemorrhoids or itching.    Historical Provider, MD  latanoprost (XALATAN) 0.005 % ophthalmic solution Place 1 drop into both eyes at bedtime.    Historical Provider, MD  levothyroxine (SYNTHROID, LEVOTHROID) 100 MCG tablet Take 100 mcg by mouth daily before breakfast.    Historical Provider, MD  naproxen (NAPROSYN) 500 MG tablet Take 500 mg by mouth every 12 (twelve) hours as needed.    Historical Provider, MD  pravastatin (PRAVACHOL) 40 MG tablet Take 40 mg by mouth daily.    Historical Provider, MD  TIMOLOL HEMIHYDRATE OP Apply 1 drop to eye daily.    Historical Provider, MD  Vitamin D, Ergocalciferol, (DRISDOL) 50000 units CAPS capsule Take 50,000 Units by mouth every 7 (seven) days.    Historical Provider, MD    Family History Family History  Problem Relation Age of Onset  . Heart attack Father 29  . Stroke Father   . CVA Father   . Arthritis Father   . Stroke Mother 33  . Hypertension Mother   . Arthritis Mother   . Colon polyps Mother   . CVA Mother   . Leukemia Sister 28  . Melanoma Sister     STAGE IV MELANOMA OF THE BRAIN METASTATIC  . Heart attack Other     grandfahter    Social History Social History  Substance Use Topics  . Smoking status: Never Smoker  . Smokeless tobacco: Never Used  . Alcohol use Yes     Comment: glass of wine rarely     Allergies   Doxazosin mesylate and Univasc [moexipril]   Review of Systems Review of Systems  Constitutional: Negative for chills and  fever.  HENT: Negative for sneezing.   Eyes: Negative for pain, discharge, redness and visual disturbance.  Respiratory: Negative for cough.   Gastrointestinal: Negative for nausea and vomiting.  Musculoskeletal: Negative for neck stiffness.  Skin: Positive for color change (to area below left eye).  Neurological: Negative for headaches.  Hematological: Does not bruise/bleed easily.  All other systems reviewed and are negative.  Physical Exam Updated Vital Signs BP 158/83 (BP Location: Left Arm)   Pulse 70   Temp 98.2 F (36.8 C) (Oral)   Resp 16   SpO2 97%   Physical Exam  Constitutional: She is oriented to person, place, and time. She appears well-developed and well-nourished. No distress.  HENT:  Head: Normocephalic and atraumatic.  Nose: Nose normal. No mucosal edema or rhinorrhea.  Mouth/Throat: Uvula is midline, oropharynx is clear and moist and mucous membranes are normal. No uvula swelling. No oropharyngeal exudate, posterior oropharyngeal edema, posterior oropharyngeal erythema or tonsillar abscesses.  Eyes: Conjunctivae, EOM and lids are normal. Pupils are equal, round, and reactive to light. Lids are everted and swept, no foreign bodies found. Right eye exhibits no chemosis, no discharge and no exudate. No foreign body present in the right eye. Left eye exhibits no chemosis, no discharge and no exudate. No foreign body present in the left eye. Right conjunctiva is not injected. Right conjunctiva has no hemorrhage. Left conjunctiva is not injected. Left conjunctiva has no hemorrhage.  Slit lamp exam:      The right eye shows no corneal abrasion, no corneal flare, no corneal ulcer, no foreign body, no fluorescein uptake and no anterior chamber bulge.       The left eye shows no corneal abrasion, no corneal flare, no corneal ulcer, no foreign body, no fluorescein uptake and no anterior chamber bulge.  Pupils equal round and reactive to light No vertical, horizontal or  rotational nystagmus No Corneal abrasion noted  No visible foreign body No corneal flare, ulcer or dendritic staining  No herpetic lesions to the face or around the eye  Neck: Normal range of motion.  Full range of motion without pain No midline or paraspinal tenderness No nuchal rigidity; no meningeal signs  Cardiovascular: Normal rate, regular rhythm and intact distal pulses.   Pulmonary/Chest: Effort normal. No respiratory distress.  Musculoskeletal: Normal range of motion.  Neurological: She is alert and oriented to person, place, and time.  Mental Status:  Alert, oriented, thought content appropriate. Speech fluent without evidence of aphasia. Able to follow 2 step commands without difficulty.   Cranial Nerves:  II:  Peripheral visual fields grossly normal, pupils equal, round, reactive to light III,IV, VI:  ptosis not present, extra-ocular motions intact bilaterally  V,VII: smile symmetric, facial light touch sensation equal VIII: hearing grossly normal bilaterally  IX,X: gag reflex present  XI: bilateral shoulder shrug equal and strong XII: midline tongue extension   Skin: Skin is warm and dry. She is not diaphoretic. No erythema.  Psychiatric: She has a normal mood and affect.  Nursing note and vitals reviewed.    ED Treatments / Results  DIAGNOSTIC STUDIES: Oxygen Saturation is 97% on RA, adequate by my interpretation.    COORDINATION OF CARE: 12:19 AM Discussed treatment plan with pt at bedside which includes Slit-lamp exam and pt agreed to plan.   Procedures Procedures (including critical care time)  Medications Ordered in ED Medications  fluorescein ophthalmic strip 2 strip (2 strips Both Eyes Given 07/08/16 0107)  tetracaine (PONTOCAINE) 0.5 % ophthalmic solution 2 drop (2 drops Both Eyes Given 07/08/16 0108)     Initial Impression / Assessment and Plan / ED Course  I have reviewed the triage vital signs and the nursing notes.  Pertinent labs & imaging  results that were available during my care of the patient were reviewed by me and considered in my medical decision making (see chart for details).  Clinical Course    Patient with ecchymosis just under the left eye. She denies known trauma. Small varicose veins noted in the area of the ecchymosis. No open wounds. No herpetic lesions. No tenderness to palpation. No induration. No evidence of infection or herpetic outbreak. Slit-lamp exam without corneal abrasion or dendritic staining. No visual changes. Patient will follow with her ophthalmologist in 48 hours.  Cool compresses recommended.  Discussed reasons to return to the emergency department including fever, eruption of vesicles, signs of infection or other concerns.   I personally performed the services described in this documentation, which was scribed in my presence. The recorded information has been reviewed and is accurate.   Final Clinical Impressions(s) / ED Diagnoses   Final diagnoses:  Ecchymosis of left eye    New Prescriptions Discharge Medication List as of 07/08/2016  1:06 AM       Abigail Butts, [name] 07/08/16 0548    April Palumbo, MD 07/08/16 [phone]

## 2016-07-08 NOTE — ED Triage Notes (Signed)
Pt from home with complaints of bruising around her left eye. Pt states she noticed it this evening. Pt denies pain and denies known injury to the area. Pt denies changes in vision. Pt states that 3 weeks ago she had an infection around her eyebrow that spread to the rest of the left side of her face. Pt states she thinks this may be related. Pt is not on bloodthinners .

## 2016-07-19 DIAGNOSIS — R0981 Nasal congestion: Secondary | ICD-10-CM | POA: Diagnosis not present

## 2016-08-13 DIAGNOSIS — E039 Hypothyroidism, unspecified: Secondary | ICD-10-CM | POA: Diagnosis not present

## 2016-08-13 DIAGNOSIS — R42 Dizziness and giddiness: Secondary | ICD-10-CM | POA: Diagnosis not present

## 2016-08-13 DIAGNOSIS — I10 Essential (primary) hypertension: Secondary | ICD-10-CM | POA: Diagnosis not present

## 2016-08-13 DIAGNOSIS — M8580 Other specified disorders of bone density and structure, unspecified site: Secondary | ICD-10-CM | POA: Diagnosis not present

## 2016-08-13 DIAGNOSIS — E663 Overweight: Secondary | ICD-10-CM | POA: Diagnosis not present

## 2016-08-13 DIAGNOSIS — Z6828 Body mass index (BMI) 28.0-28.9, adult: Secondary | ICD-10-CM | POA: Diagnosis not present

## 2016-08-13 DIAGNOSIS — Z862 Personal history of diseases of the blood and blood-forming organs and certain disorders involving the immune mechanism: Secondary | ICD-10-CM | POA: Diagnosis not present

## 2016-08-13 DIAGNOSIS — E7800 Pure hypercholesterolemia, unspecified: Secondary | ICD-10-CM | POA: Diagnosis not present

## 2016-08-13 DIAGNOSIS — H409 Unspecified glaucoma: Secondary | ICD-10-CM | POA: Diagnosis not present

## 2016-09-05 DIAGNOSIS — Z1231 Encounter for screening mammogram for malignant neoplasm of breast: Secondary | ICD-10-CM | POA: Diagnosis not present

## 2016-10-01 DIAGNOSIS — H2513 Age-related nuclear cataract, bilateral: Secondary | ICD-10-CM | POA: Diagnosis not present

## 2016-10-01 DIAGNOSIS — H401131 Primary open-angle glaucoma, bilateral, mild stage: Secondary | ICD-10-CM | POA: Diagnosis not present

## 2017-02-08 DIAGNOSIS — E559 Vitamin D deficiency, unspecified: Secondary | ICD-10-CM | POA: Diagnosis not present

## 2017-02-08 DIAGNOSIS — I10 Essential (primary) hypertension: Secondary | ICD-10-CM | POA: Diagnosis not present

## 2017-02-08 DIAGNOSIS — H409 Unspecified glaucoma: Secondary | ICD-10-CM | POA: Diagnosis not present

## 2017-02-08 DIAGNOSIS — E039 Hypothyroidism, unspecified: Secondary | ICD-10-CM | POA: Diagnosis not present

## 2017-02-08 DIAGNOSIS — M79671 Pain in right foot: Secondary | ICD-10-CM | POA: Diagnosis not present

## 2017-02-08 DIAGNOSIS — E7800 Pure hypercholesterolemia, unspecified: Secondary | ICD-10-CM | POA: Diagnosis not present

## 2017-02-08 DIAGNOSIS — Z0000 Encounter for general adult medical examination without abnormal findings: Secondary | ICD-10-CM | POA: Diagnosis not present

## 2017-02-08 DIAGNOSIS — J309 Allergic rhinitis, unspecified: Secondary | ICD-10-CM | POA: Diagnosis not present

## 2017-02-08 DIAGNOSIS — M79672 Pain in left foot: Secondary | ICD-10-CM | POA: Diagnosis not present

## 2017-02-08 DIAGNOSIS — M8580 Other specified disorders of bone density and structure, unspecified site: Secondary | ICD-10-CM | POA: Diagnosis not present

## 2017-02-08 DIAGNOSIS — J069 Acute upper respiratory infection, unspecified: Secondary | ICD-10-CM | POA: Diagnosis not present

## 2017-03-07 ENCOUNTER — Ambulatory Visit (INDEPENDENT_AMBULATORY_CARE_PROVIDER_SITE_OTHER): Payer: PPO

## 2017-03-07 ENCOUNTER — Ambulatory Visit (INDEPENDENT_AMBULATORY_CARE_PROVIDER_SITE_OTHER): Payer: PPO | Admitting: Podiatry

## 2017-03-07 ENCOUNTER — Encounter: Payer: Self-pay | Admitting: Podiatry

## 2017-03-07 DIAGNOSIS — M79671 Pain in right foot: Secondary | ICD-10-CM

## 2017-03-07 DIAGNOSIS — M2041 Other hammer toe(s) (acquired), right foot: Secondary | ICD-10-CM

## 2017-03-07 DIAGNOSIS — M79672 Pain in left foot: Secondary | ICD-10-CM

## 2017-03-07 DIAGNOSIS — M779 Enthesopathy, unspecified: Secondary | ICD-10-CM | POA: Diagnosis not present

## 2017-03-07 DIAGNOSIS — M19072 Primary osteoarthritis, left ankle and foot: Secondary | ICD-10-CM | POA: Diagnosis not present

## 2017-03-07 NOTE — Progress Notes (Signed)
**Note Gina-Identified via Obfuscation**    Subjective:    Patient ID: Gina Ford, female    DOB: Jul 19, 1941, 76 y.o.   MRN: [id number]  HPI: She presents today with a chief complaint of pain to the dorsal aspect of her left foot. She states she is also having pain to the second toe of the left foot. She also has tenderness to the second toe of the right foot. Denies any trauma.    Review of Systems  All other systems reviewed and are negative.      Objective:   Physical Exam: Vital signs are stable alert and oriented 3. Pulses are palpable. Neurologic sensorium is intact. Deep tendon reflexes are intact. Muscle strength is normal bilateral symmetrical. Orthopedic evaluation demonstrates moderate arch height with pain on palpation dorsal aspect of the left foot. Medial deviation of the second third and fourth digits of the right foot. Mild hallux valgus deformities bilateral. 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 She also has related back trauma with a likely resulting in medial deviation of the second digits. No other lesions or wounds are noted.        Assessment & Plan:  Assessment: Medial deviation of the second digits. 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  Plan: We discussed etiology pathology concerned over surgical therapies. At this point she does not want any treatment she will notify us with questions or concerns. She is well satisfied knowing that it is arthritis.

## 2017-04-08 DIAGNOSIS — H43813 Vitreous degeneration, bilateral: Secondary | ICD-10-CM | POA: Diagnosis not present

## 2017-04-08 DIAGNOSIS — H2513 Age-related nuclear cataract, bilateral: Secondary | ICD-10-CM | POA: Diagnosis not present

## 2017-04-08 DIAGNOSIS — H401131 Primary open-angle glaucoma, bilateral, mild stage: Secondary | ICD-10-CM | POA: Diagnosis not present

## 2017-04-08 DIAGNOSIS — H524 Presbyopia: Secondary | ICD-10-CM | POA: Diagnosis not present

## 2017-04-23 DIAGNOSIS — M545 Low back pain: Secondary | ICD-10-CM | POA: Diagnosis not present

## 2017-06-05 DIAGNOSIS — K6289 Other specified diseases of anus and rectum: Secondary | ICD-10-CM | POA: Diagnosis not present

## 2017-06-05 DIAGNOSIS — L0292 Furuncle, unspecified: Secondary | ICD-10-CM | POA: Diagnosis not present

## 2017-06-05 DIAGNOSIS — K640 First degree hemorrhoids: Secondary | ICD-10-CM | POA: Diagnosis not present

## 2017-06-19 DIAGNOSIS — M79622 Pain in left upper arm: Secondary | ICD-10-CM | POA: Diagnosis not present

## 2017-07-13 DIAGNOSIS — N3090 Cystitis, unspecified without hematuria: Secondary | ICD-10-CM | POA: Diagnosis not present

## 2017-07-13 DIAGNOSIS — N3081 Other cystitis with hematuria: Secondary | ICD-10-CM | POA: Diagnosis not present

## 2017-07-13 DIAGNOSIS — N390 Urinary tract infection, site not specified: Secondary | ICD-10-CM | POA: Diagnosis not present

## 2017-08-14 DIAGNOSIS — M7661 Achilles tendinitis, right leg: Secondary | ICD-10-CM | POA: Diagnosis not present

## 2017-08-14 DIAGNOSIS — E039 Hypothyroidism, unspecified: Secondary | ICD-10-CM | POA: Diagnosis not present

## 2017-08-14 DIAGNOSIS — M7662 Achilles tendinitis, left leg: Secondary | ICD-10-CM | POA: Diagnosis not present

## 2017-08-14 DIAGNOSIS — E7800 Pure hypercholesterolemia, unspecified: Secondary | ICD-10-CM | POA: Diagnosis not present

## 2017-08-14 DIAGNOSIS — I10 Essential (primary) hypertension: Secondary | ICD-10-CM | POA: Diagnosis not present

## 2017-08-14 DIAGNOSIS — E663 Overweight: Secondary | ICD-10-CM | POA: Diagnosis not present

## 2017-10-14 DIAGNOSIS — H401131 Primary open-angle glaucoma, bilateral, mild stage: Secondary | ICD-10-CM | POA: Diagnosis not present

## 2018-01-13 DIAGNOSIS — H401131 Primary open-angle glaucoma, bilateral, mild stage: Secondary | ICD-10-CM | POA: Diagnosis not present

## 2018-02-24 DIAGNOSIS — Z1231 Encounter for screening mammogram for malignant neoplasm of breast: Secondary | ICD-10-CM | POA: Diagnosis not present

## 2018-02-27 DIAGNOSIS — R921 Mammographic calcification found on diagnostic imaging of breast: Secondary | ICD-10-CM | POA: Diagnosis not present

## 2018-04-04 DIAGNOSIS — I10 Essential (primary) hypertension: Secondary | ICD-10-CM | POA: Diagnosis not present

## 2018-04-04 DIAGNOSIS — Z0000 Encounter for general adult medical examination without abnormal findings: Secondary | ICD-10-CM | POA: Diagnosis not present

## 2018-04-04 DIAGNOSIS — Z6826 Body mass index (BMI) 26.0-26.9, adult: Secondary | ICD-10-CM | POA: Diagnosis not present

## 2018-04-04 DIAGNOSIS — E7800 Pure hypercholesterolemia, unspecified: Secondary | ICD-10-CM | POA: Diagnosis not present

## 2018-04-04 DIAGNOSIS — Z1389 Encounter for screening for other disorder: Secondary | ICD-10-CM | POA: Diagnosis not present

## 2018-04-04 DIAGNOSIS — E559 Vitamin D deficiency, unspecified: Secondary | ICD-10-CM | POA: Diagnosis not present

## 2018-04-04 DIAGNOSIS — E039 Hypothyroidism, unspecified: Secondary | ICD-10-CM | POA: Diagnosis not present

## 2018-04-21 DIAGNOSIS — M9901 Segmental and somatic dysfunction of cervical region: Secondary | ICD-10-CM | POA: Diagnosis not present

## 2018-04-21 DIAGNOSIS — M5384 Other specified dorsopathies, thoracic region: Secondary | ICD-10-CM | POA: Diagnosis not present

## 2018-04-21 DIAGNOSIS — M5033 Other cervical disc degeneration, cervicothoracic region: Secondary | ICD-10-CM | POA: Diagnosis not present

## 2018-04-21 DIAGNOSIS — M9902 Segmental and somatic dysfunction of thoracic region: Secondary | ICD-10-CM | POA: Diagnosis not present

## 2018-06-09 DIAGNOSIS — M47816 Spondylosis without myelopathy or radiculopathy, lumbar region: Secondary | ICD-10-CM | POA: Diagnosis not present

## 2018-07-16 DIAGNOSIS — H5203 Hypermetropia, bilateral: Secondary | ICD-10-CM | POA: Diagnosis not present

## 2018-07-16 DIAGNOSIS — H25013 Cortical age-related cataract, bilateral: Secondary | ICD-10-CM | POA: Diagnosis not present

## 2018-07-16 DIAGNOSIS — H401131 Primary open-angle glaucoma, bilateral, mild stage: Secondary | ICD-10-CM | POA: Diagnosis not present

## 2018-07-16 DIAGNOSIS — H2513 Age-related nuclear cataract, bilateral: Secondary | ICD-10-CM | POA: Diagnosis not present

## 2018-09-04 DIAGNOSIS — R921 Mammographic calcification found on diagnostic imaging of breast: Secondary | ICD-10-CM | POA: Diagnosis not present

## 2018-11-19 DIAGNOSIS — H2513 Age-related nuclear cataract, bilateral: Secondary | ICD-10-CM | POA: Diagnosis not present

## 2018-11-19 DIAGNOSIS — H43813 Vitreous degeneration, bilateral: Secondary | ICD-10-CM | POA: Diagnosis not present

## 2018-11-19 DIAGNOSIS — H25013 Cortical age-related cataract, bilateral: Secondary | ICD-10-CM | POA: Diagnosis not present

## 2018-11-19 DIAGNOSIS — H401131 Primary open-angle glaucoma, bilateral, mild stage: Secondary | ICD-10-CM | POA: Diagnosis not present

## 2019-01-13 DIAGNOSIS — M546 Pain in thoracic spine: Secondary | ICD-10-CM | POA: Diagnosis not present

## 2019-01-13 DIAGNOSIS — M549 Dorsalgia, unspecified: Secondary | ICD-10-CM | POA: Diagnosis not present

## 2019-01-13 DIAGNOSIS — M5136 Other intervertebral disc degeneration, lumbar region: Secondary | ICD-10-CM | POA: Diagnosis not present

## 2019-01-13 DIAGNOSIS — M47816 Spondylosis without myelopathy or radiculopathy, lumbar region: Secondary | ICD-10-CM | POA: Diagnosis not present

## 2019-01-13 DIAGNOSIS — M47814 Spondylosis without myelopathy or radiculopathy, thoracic region: Secondary | ICD-10-CM | POA: Diagnosis not present

## 2019-01-13 DIAGNOSIS — M5134 Other intervertebral disc degeneration, thoracic region: Secondary | ICD-10-CM | POA: Diagnosis not present

## 2019-01-15 DIAGNOSIS — M5134 Other intervertebral disc degeneration, thoracic region: Secondary | ICD-10-CM | POA: Diagnosis not present

## 2019-01-15 DIAGNOSIS — M47814 Spondylosis without myelopathy or radiculopathy, thoracic region: Secondary | ICD-10-CM | POA: Diagnosis not present

## 2019-01-15 DIAGNOSIS — M4316 Spondylolisthesis, lumbar region: Secondary | ICD-10-CM | POA: Diagnosis not present

## 2019-01-15 DIAGNOSIS — M47816 Spondylosis without myelopathy or radiculopathy, lumbar region: Secondary | ICD-10-CM | POA: Diagnosis not present

## 2019-01-15 DIAGNOSIS — M8938 Hypertrophy of bone, other site: Secondary | ICD-10-CM | POA: Diagnosis not present

## 2019-01-15 DIAGNOSIS — M5124 Other intervertebral disc displacement, thoracic region: Secondary | ICD-10-CM | POA: Diagnosis not present

## 2019-01-15 DIAGNOSIS — M5126 Other intervertebral disc displacement, lumbar region: Secondary | ICD-10-CM | POA: Diagnosis not present

## 2019-01-19 DIAGNOSIS — M47816 Spondylosis without myelopathy or radiculopathy, lumbar region: Secondary | ICD-10-CM | POA: Diagnosis not present

## 2019-01-19 DIAGNOSIS — M5134 Other intervertebral disc degeneration, thoracic region: Secondary | ICD-10-CM | POA: Diagnosis not present

## 2019-01-19 DIAGNOSIS — M47814 Spondylosis without myelopathy or radiculopathy, thoracic region: Secondary | ICD-10-CM | POA: Diagnosis not present

## 2019-01-19 DIAGNOSIS — M5136 Other intervertebral disc degeneration, lumbar region: Secondary | ICD-10-CM | POA: Diagnosis not present

## 2019-02-24 DIAGNOSIS — R921 Mammographic calcification found on diagnostic imaging of breast: Secondary | ICD-10-CM | POA: Diagnosis not present

## 2019-02-27 DIAGNOSIS — D649 Anemia, unspecified: Secondary | ICD-10-CM | POA: Diagnosis not present

## 2019-02-27 DIAGNOSIS — E7800 Pure hypercholesterolemia, unspecified: Secondary | ICD-10-CM | POA: Diagnosis not present

## 2019-02-27 DIAGNOSIS — E039 Hypothyroidism, unspecified: Secondary | ICD-10-CM | POA: Diagnosis not present

## 2019-02-27 DIAGNOSIS — I10 Essential (primary) hypertension: Secondary | ICD-10-CM | POA: Diagnosis not present

## 2019-02-27 DIAGNOSIS — H409 Unspecified glaucoma: Secondary | ICD-10-CM | POA: Diagnosis not present

## 2019-02-27 DIAGNOSIS — D509 Iron deficiency anemia, unspecified: Secondary | ICD-10-CM | POA: Diagnosis not present

## 2019-04-20 DIAGNOSIS — E559 Vitamin D deficiency, unspecified: Secondary | ICD-10-CM | POA: Diagnosis not present

## 2019-04-20 DIAGNOSIS — I10 Essential (primary) hypertension: Secondary | ICD-10-CM | POA: Diagnosis not present

## 2019-04-20 DIAGNOSIS — Z6825 Body mass index (BMI) 25.0-25.9, adult: Secondary | ICD-10-CM | POA: Diagnosis not present

## 2019-04-20 DIAGNOSIS — Z0000 Encounter for general adult medical examination without abnormal findings: Secondary | ICD-10-CM | POA: Diagnosis not present

## 2019-04-20 DIAGNOSIS — E7800 Pure hypercholesterolemia, unspecified: Secondary | ICD-10-CM | POA: Diagnosis not present

## 2019-04-20 DIAGNOSIS — M8580 Other specified disorders of bone density and structure, unspecified site: Secondary | ICD-10-CM | POA: Diagnosis not present

## 2019-04-20 DIAGNOSIS — E663 Overweight: Secondary | ICD-10-CM | POA: Diagnosis not present

## 2019-04-20 DIAGNOSIS — H409 Unspecified glaucoma: Secondary | ICD-10-CM | POA: Diagnosis not present

## 2019-04-20 DIAGNOSIS — E039 Hypothyroidism, unspecified: Secondary | ICD-10-CM | POA: Diagnosis not present

## 2019-05-25 DIAGNOSIS — H2513 Age-related nuclear cataract, bilateral: Secondary | ICD-10-CM | POA: Diagnosis not present

## 2019-05-25 DIAGNOSIS — H401131 Primary open-angle glaucoma, bilateral, mild stage: Secondary | ICD-10-CM | POA: Diagnosis not present

## 2019-05-25 DIAGNOSIS — H25013 Cortical age-related cataract, bilateral: Secondary | ICD-10-CM | POA: Diagnosis not present

## 2019-05-25 DIAGNOSIS — H52203 Unspecified astigmatism, bilateral: Secondary | ICD-10-CM | POA: Diagnosis not present

## 2019-06-11 DIAGNOSIS — H2511 Age-related nuclear cataract, right eye: Secondary | ICD-10-CM | POA: Diagnosis not present

## 2019-06-11 DIAGNOSIS — H25811 Combined forms of age-related cataract, right eye: Secondary | ICD-10-CM | POA: Diagnosis not present

## 2019-06-11 DIAGNOSIS — H25011 Cortical age-related cataract, right eye: Secondary | ICD-10-CM | POA: Diagnosis not present

## 2019-06-25 DIAGNOSIS — H25812 Combined forms of age-related cataract, left eye: Secondary | ICD-10-CM | POA: Diagnosis not present

## 2019-06-25 DIAGNOSIS — H25012 Cortical age-related cataract, left eye: Secondary | ICD-10-CM | POA: Diagnosis not present

## 2019-06-25 DIAGNOSIS — H2512 Age-related nuclear cataract, left eye: Secondary | ICD-10-CM | POA: Diagnosis not present

## 2019-07-08 DIAGNOSIS — E7800 Pure hypercholesterolemia, unspecified: Secondary | ICD-10-CM | POA: Diagnosis not present

## 2019-07-08 DIAGNOSIS — D509 Iron deficiency anemia, unspecified: Secondary | ICD-10-CM | POA: Diagnosis not present

## 2019-07-08 DIAGNOSIS — H409 Unspecified glaucoma: Secondary | ICD-10-CM | POA: Diagnosis not present

## 2019-07-08 DIAGNOSIS — D649 Anemia, unspecified: Secondary | ICD-10-CM | POA: Diagnosis not present

## 2019-07-08 DIAGNOSIS — I10 Essential (primary) hypertension: Secondary | ICD-10-CM | POA: Diagnosis not present

## 2019-07-08 DIAGNOSIS — E039 Hypothyroidism, unspecified: Secondary | ICD-10-CM | POA: Diagnosis not present

## 2019-07-24 DIAGNOSIS — Z961 Presence of intraocular lens: Secondary | ICD-10-CM | POA: Diagnosis not present

## 2019-07-27 DIAGNOSIS — M62838 Other muscle spasm: Secondary | ICD-10-CM | POA: Diagnosis not present

## 2019-07-27 DIAGNOSIS — R030 Elevated blood-pressure reading, without diagnosis of hypertension: Secondary | ICD-10-CM | POA: Diagnosis not present

## 2019-08-10 DIAGNOSIS — E7800 Pure hypercholesterolemia, unspecified: Secondary | ICD-10-CM | POA: Diagnosis not present

## 2019-08-10 DIAGNOSIS — H409 Unspecified glaucoma: Secondary | ICD-10-CM | POA: Diagnosis not present

## 2019-08-10 DIAGNOSIS — D649 Anemia, unspecified: Secondary | ICD-10-CM | POA: Diagnosis not present

## 2019-08-10 DIAGNOSIS — I10 Essential (primary) hypertension: Secondary | ICD-10-CM | POA: Diagnosis not present

## 2019-08-10 DIAGNOSIS — D509 Iron deficiency anemia, unspecified: Secondary | ICD-10-CM | POA: Diagnosis not present

## 2019-08-10 DIAGNOSIS — E039 Hypothyroidism, unspecified: Secondary | ICD-10-CM | POA: Diagnosis not present

## 2019-09-29 ENCOUNTER — Ambulatory Visit (INDEPENDENT_AMBULATORY_CARE_PROVIDER_SITE_OTHER): Payer: Medicare Other | Admitting: Cardiovascular Disease

## 2019-09-29 ENCOUNTER — Ambulatory Visit (INDEPENDENT_AMBULATORY_CARE_PROVIDER_SITE_OTHER): Payer: Medicare Other

## 2019-09-29 ENCOUNTER — Other Ambulatory Visit: Payer: Self-pay

## 2019-09-29 ENCOUNTER — Encounter: Payer: Self-pay | Admitting: Cardiovascular Disease

## 2019-09-29 VITALS — BP 130/70 | HR 77 | Ht 62.0 in | Wt 145.8 lb

## 2019-09-29 DIAGNOSIS — I10 Essential (primary) hypertension: Secondary | ICD-10-CM | POA: Diagnosis not present

## 2019-09-29 DIAGNOSIS — I493 Ventricular premature depolarization: Secondary | ICD-10-CM

## 2019-09-29 DIAGNOSIS — R0600 Dyspnea, unspecified: Secondary | ICD-10-CM

## 2019-09-29 DIAGNOSIS — R0609 Other forms of dyspnea: Secondary | ICD-10-CM

## 2019-09-29 DIAGNOSIS — E785 Hyperlipidemia, unspecified: Secondary | ICD-10-CM

## 2019-09-29 DIAGNOSIS — R002 Palpitations: Secondary | ICD-10-CM

## 2019-09-29 NOTE — Progress Notes (Signed)
Cardiology Office Note   Date:  09/29/2019   ID:  Gina Ford, DOB September 29, 1940, MRN [id number]  PCP:  Kathyrn Lass, MD  Cardiologist:   Kathlyn Sacramento, MD   Chief Complaint  Patient presents with  . New Patient (Initial Visit)    Ref by Kathyrn Lass, MD for palpitations and HTN. Meds reviewed by the pt. verbally. Pt. c/o irreg. heart beats & chest discomfort at times.       History of Present Illness: Gina Ford is a 79 y.o. female who was referred by Dr. Sabra Heck for evaluation of palpitations and PVCs. She has known history of essential hypertension, hyperlipidemia and hypothyroidism on replacement therapy with levothyroxine. She was evaluated by our group in 2012 for atypical chest pain.  She underwent a treadmill nuclear stress test which showed no evidence of ischemia with normal ejection fraction.  She was recently noted to have PVCs and thus she was referred for evaluation.  She reports overall mild palpitations and mild exertional dyspnea.  She denies any chest pain.  No dizziness or recent syncope.  She does report previous loss of consciousness with blood draws She is not a smoker.  She does report family history of coronary artery disease but she does not know the details.   Past Medical History:  Diagnosis Date  . Abnormal mammogram    NEED R DIAGNO MAMMO 3/12  . Achilles tendonitis    B 2ND AND 3RD MTP SYNOVITIS DR. Beola Cord  . Actinic keratosis    HAND 6/11 KELLIE SHEFFIELD, PA  . Allergic rhinitis   . Anemia   . Arthritis   . GERD (gastroesophageal reflux disease)   . Glaucoma   . HLD (hyperlipidemia)   . HTN (hypertension)   . Hyperthyroidism   . Low serum vitamin D 01/2015  . Obesity     Past Surgical History:  Procedure Laterality Date  . COLONOSCOPY    . ESOPHAGOGASTRODUODENOSCOPY  2012, 2016  . INGUINAL HERNIA REPAIR Left   . KNEE ARTHROSCOPY    . LOBECTOMY     Right Thyroid, then Left  . THYROIDECTOMY  1991  . TUBAL LIGATION    .  VARICOSE VEIN SURGERY       Current Outpatient Medications  Medication Sig Dispense Refill  . amLODipine (NORVASC) 5 MG tablet Take 5 mg by mouth daily.    . hydrocortisone (ANUSOL-HC) 2.5 % rectal cream Place 1 application rectally 2 (two) times daily as needed for hemorrhoids or itching.    . latanoprost (XALATAN) 0.005 % ophthalmic solution Place 1 drop into both eyes at bedtime.    Marland Kitchen levothyroxine (SYNTHROID, LEVOTHROID) 100 MCG tablet Take 100 mcg by mouth daily before breakfast.    . naproxen (NAPROSYN) 500 MG tablet Take 500 mg by mouth every 12 (twelve) hours as needed.    . pravastatin (PRAVACHOL) 40 MG tablet Take 40 mg by mouth daily.    Marland Kitchen TIMOLOL HEMIHYDRATE OP Apply 1 drop to eye daily.    . Vitamin D, Ergocalciferol, (DRISDOL) 50000 units CAPS capsule Take 50,000 Units by mouth every 7 (seven) days.     No current facility-administered medications for this visit.    Allergies:   Doxazosin mesylate and Univasc [moexipril]    Social History:  The patient  reports that she has never smoked. She has never used smokeless tobacco. She reports current alcohol use. She reports that she does not use drugs.   Family History:  The patient's family history  includes Arthritis in her father and mother; CVA in her father and mother; Colon polyps in her mother; Heart attack in an other family member; Heart attack (age of onset: 95) in her father; Hypertension in her mother; Leukemia (age of onset: 68) in her sister; Melanoma in her sister; Stroke in her father; Stroke (age of onset: 34) in her mother.    ROS:  Please see the history of present illness.   Otherwise, review of systems are positive for none.   All other systems are reviewed and negative.    PHYSICAL EXAM: VS:  BP 130/70 (BP Location: Right Arm, Patient Position: Sitting, Cuff Size: Normal)   Pulse 77   Ht 5\' 2"  (1.575 m)   Wt 145 lb 12 oz (66.1 kg)   BMI 26.66 kg/m  , BMI Body mass index is 26.66 kg/m. GEN: Well  nourished, well developed, in no acute distress  HEENT: normal  Neck: no JVD, carotid bruits, or masses Cardiac: RRR; no murmurs, rubs, or gallops,no edema  Respiratory:  clear to auscultation bilaterally, normal work of breathing GI: soft, nontender, nondistended, + BS MS: no deformity or atrophy  Skin: warm and dry, no rash Neuro:  Strength and sensation are intact Psych: euthymic mood, full affect   EKG:  EKG is ordered today. The ekg ordered today demonstrates sinus rhythm with a PVC.  Possible old septal infarct.  Recent Labs: No results found for requested labs within last 8760 hours.    Lipid Panel No results found for: CHOL, TRIG, HDL, CHOLHDL, VLDL, LDLCALC, LDLDIRECT    Wt Readings from Last 3 Encounters:  09/29/19 145 lb 12 oz (66.1 kg)  09/21/10 148 lb (67.1 kg)        PAD Screen 09/29/2019  Previous PAD dx? No  Previous surgical procedure? No  Pain with walking? No  Feet/toe relief with dangling? No  Painful, non-healing ulcers? No  Extremities discolored? No      ASSESSMENT AND PLAN:  1.  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  I am going to obtain a 3-day outpatient monitor to quantify the burden.  Recommend checking TSH if that has not been done recently. If EF is normal and the burden is low, I do not think she will require treatment of this given the overall mild symptoms.  2.  Mild exertional dyspnea: No chest pain.  I requested an echocardiogram to ensure normal ejection fraction in the setting of PVCs.  3.  Essential hypertension: Blood pressure is controlled.  4.  Hyperlipidemia: Currently on pravastatin 40 mg daily.    Disposition:   FU with me as needed  Signed,  Kathlyn Sacramento, MD  09/29/2019 3:37 PM    [place]

## 2019-09-29 NOTE — Patient Instructions (Addendum)
Medication Instructions:  No medication changes *If you need a refill on your cardiac medications before your next appointment, please call your pharmacy*  Lab Work: None ordered If you have labs (blood work) drawn today and your tests are completely normal, you will receive your results only by: Marland Kitchen MyChart Message (if you have MyChart) OR . A paper copy in the mail If you have any lab test that is abnormal or we need to change your treatment, we will call you to review the results.  Testing/Procedures:  Your physician has recommended that you wear a Zio monitor. Zio monitors are medical devices that record the heart's electrical activity. Doctors most often [place] these monitors to diagnose arrhythmias. Arrhythmias are problems with the speed or rhythm of the heartbeat. The monitor is a small, portable device. You can wear one while you do your normal daily activities. This is usually used to diagnose what is causing palpitations/syncope (passing out).  Your physician has requested that you have an echocardiogram. Echocardiography is a painless test that uses sound waves to create images of your heart. It provides your doctor with information about the size and shape of your heart and how well your heart's chambers and valves are working. This procedure takes approximately one hour. There are no restrictions for this procedure. You need to wear the monitor for 3 days.    Follow-Up: At [facility], you and your health needs are our priority.  As part of our continuing mission to provide you with exceptional heart care, we have created designated Provider Care Teams.  These Care Teams include your primary Cardiologist (physician) and Advanced Practice Providers (APPs -  Physician Assistants and Nurse Practitioners) who all work together to provide you with the care you need, when you need it.  Your next appointment:   Follow up as needed  The format for your next appointment:   In  Person  Provider:    You may see No primary care provider on file. or one of the following Advanced Practice Providers on your designated Care Team:    Murray Hodgkins, NP  Christell Faith, [name]  Marrianne Mood, [name]   Other Instructions  Echocardiogram An echocardiogram is a procedure that uses painless sound waves (ultrasound) to produce an image of the heart. Images from an echocardiogram can provide important information about:  Signs of coronary artery disease (CAD).  Aneurysm detection. An aneurysm is a weak or damaged part of an artery wall that bulges out from the normal force of blood pumping through the body.  Heart size and shape. Changes in the size or shape of the heart can be associated with certain conditions, including heart failure, aneurysm, and CAD.  Heart muscle function.  Heart valve function.  Signs of a past heart attack.  Fluid buildup around the heart.  Thickening of the heart muscle.  A tumor or infectious growth around the heart valves. Tell a health care provider about:  Any allergies you have.  All medicines you are taking, including vitamins, herbs, eye drops, creams, and over-the-counter medicines.  Any blood disorders you have.  Any surgeries you have had.  Any medical conditions you have.  Whether you are pregnant or may be pregnant. What are the risks? Generally, this is a safe procedure. However, problems may occur, including:  Allergic reaction to dye (contrast) that may be used during the procedure. What happens before the procedure? No specific preparation is needed. You may eat and drink normally. What happens during  the procedure?   An IV tube may be inserted into one of your veins.  You may receive contrast through this tube. A contrast is an injection that improves the quality of the pictures from your heart.  A gel will be applied to your chest.  A wand-like tool (transducer) will be moved over your chest. The gel  will help to transmit the sound waves from the transducer.  The sound waves will harmlessly bounce off of your heart to allow the heart images to be captured in real-time motion. The images will be recorded on a computer. The procedure may vary among health care providers and hospitals. What happens after the procedure?  You may return to your normal, everyday life, including diet, activities, and medicines, unless your health care provider tells you not to do that. Summary  An echocardiogram is a procedure that uses painless sound waves (ultrasound) to produce an image of the heart.  Images from an echocardiogram can provide important information about the size and shape of your heart, heart muscle function, heart valve function, and fluid buildup around your heart.  You do not need to do anything to prepare before this procedure. You may eat and drink normally.  After the echocardiogram is completed, you may return to your normal, everyday life, unless your health care provider tells you not to do that. This information is not intended to replace advice given to you by your health care provider. Make sure you discuss any questions you have with your health care provider. Document Revised: 12/11/2018 Document Reviewed: 09/22/2016 Elsevier Patient Education  [place].

## 2019-10-02 ENCOUNTER — Other Ambulatory Visit: Payer: Self-pay | Admitting: Cardiovascular Disease

## 2019-10-02 DIAGNOSIS — I493 Ventricular premature depolarization: Secondary | ICD-10-CM

## 2019-10-12 DIAGNOSIS — R002 Palpitations: Secondary | ICD-10-CM | POA: Diagnosis not present

## 2019-10-15 ENCOUNTER — Other Ambulatory Visit: Payer: Self-pay

## 2019-10-15 ENCOUNTER — Ambulatory Visit (INDEPENDENT_AMBULATORY_CARE_PROVIDER_SITE_OTHER): Payer: Medicare Other

## 2019-10-15 DIAGNOSIS — I351 Nonrheumatic aortic (valve) insufficiency: Secondary | ICD-10-CM | POA: Diagnosis not present

## 2019-10-15 DIAGNOSIS — I340 Nonrheumatic mitral (valve) insufficiency: Secondary | ICD-10-CM | POA: Diagnosis not present

## 2019-10-15 DIAGNOSIS — I493 Ventricular premature depolarization: Secondary | ICD-10-CM | POA: Diagnosis not present

## 2019-10-15 DIAGNOSIS — I517 Cardiomegaly: Secondary | ICD-10-CM | POA: Diagnosis not present

## 2019-10-23 ENCOUNTER — Telehealth: Payer: Self-pay

## 2019-10-23 DIAGNOSIS — I472 Ventricular tachycardia, unspecified: Secondary | ICD-10-CM

## 2019-10-23 NOTE — Telephone Encounter (Signed)
-----   Message from Wellington Hampshire, MD sent at 10/23/2019 12:02 PM EST ----- Inform patient that monitor showed PVCs as expected.  However, she also had short runs of ventricular tachycardia.  These are fast heartbeats coming from the lower part of the heart which is not normal.  When we see this, we have to exclude underlying ischemic heart disease.  Thus, I recommend evaluation with a Lexiscan Myoview and follow-up with me after.

## 2019-10-23 NOTE — Telephone Encounter (Signed)
Patient made aware of cardiac monitor results and Dr. Tyrell Antonio recommendation. Patient is agreeable with proceeding with the recommend lexiscan and f/u appt. Advised the patient that a scheduler will contact her to schedule.   Patient given verbal pre-test instruction with verbalized understanding.  [place]  Your caregiver has ordered a Stress Test with nuclear imaging. The purpose of this test is to evaluate the blood supply to your heart muscle. This procedure is referred to as a "Non-Invasive Stress Test." This is because other than having an IV started in your vein, nothing is inserted or "invades" your body. Cardiac stress tests are done to find areas of poor blood flow to the heart by determining the extent of coronary artery disease (CAD). Some patients exercise on a treadmill, which naturally increases the blood flow to your heart, while others who are  unable to walk on a treadmill due to physical limitations have a pharmacologic/chemical stress agent called Lexiscan . This medicine will mimic walking on a treadmill by temporarily increasing your coronary blood flow.   Please note: these test may take anywhere between 2-4 hours to complete  PLEASE REPORT TO [facility] AT THE FIRST DESK WILL DIRECT YOU WHERE TO GO  Date of Procedure:_____________________________________  Arrival Time for Procedure:______________________________  PLEASE NOTIFY THE OFFICE AT LEAST 24 HOURS IN ADVANCE IF YOU ARE UNABLE TO KEEP YOUR APPOINTMENT.  [phone] AND  PLEASE NOTIFY NUCLEAR MEDICINE AT [facility] AT LEAST 24 HOURS IN ADVANCE IF YOU ARE UNABLE TO KEEP YOUR APPOINTMENT. [phone]  How to prepare for your Myoview test:  1. Do not eat or drink after midnight 2. No caffeine for 24 hours prior to test 3. No smoking 24 hours prior to test. 4. Your medication may be taken with water.  If your doctor stopped a medication because of this test, do not take that  medication. 5. Ladies, please do not wear dresses.  Skirts or pants are appropriate. Please wear a short sleeve shirt. 6. No perfume, cologne or lotion. 7. Wear comfortable walking shoes. No heels!

## 2019-10-27 NOTE — Telephone Encounter (Signed)
Patients lexiscan sch 11/02/19, f/u appt with Dr. Fletcher Anon 11/26/19.

## 2019-11-02 ENCOUNTER — Other Ambulatory Visit: Payer: Self-pay

## 2019-11-02 ENCOUNTER — Ambulatory Visit
Admission: RE | Admit: 2019-11-02 | Discharge: 2019-11-02 | Disposition: A | Discharge status: Home / Self Care | Payer: Medicare Other | Source: Ambulatory Visit | Attending: Cardiovascular Disease | Admitting: Cardiovascular Disease

## 2019-11-02 DIAGNOSIS — I472 Ventricular tachycardia, unspecified: Secondary | ICD-10-CM

## 2019-11-02 DIAGNOSIS — R079 Chest pain, unspecified: Secondary | ICD-10-CM | POA: Insufficient documentation

## 2019-11-02 DIAGNOSIS — R0609 Other forms of dyspnea: Secondary | ICD-10-CM | POA: Diagnosis not present

## 2019-11-02 DIAGNOSIS — R0602 Shortness of breath: Secondary | ICD-10-CM | POA: Insufficient documentation

## 2019-11-02 LAB — NM MYOCAR MULTI W/SPECT W/WALL MOTION / EF
Estimated workload: 1 METS
Exercise duration (min): 0 min
Exercise duration (sec): 0 s
LV dias vol: 110 mL (ref 46–106)
LV sys vol: 55 mL
MPHR: 143 {beats}/min
Peak HR: 95 {beats}/min
Percent HR: 66 %
Rest HR: 61 {beats}/min
SDS: 4
SRS: 3
SSS: 6
TID: 1.02

## 2019-11-02 MED ORDER — TECHNETIUM TC 99M TETROFOSMIN IV KIT
9.9200 | PACK | Freq: Once | INTRAVENOUS | Status: AC | PRN
  Administered 2019-11-02: 9.92 via INTRAVENOUS

## 2019-11-02 MED ORDER — TECHNETIUM TC 99M TETROFOSMIN IV KIT
30.0000 | PACK | Freq: Once | INTRAVENOUS | Status: AC | PRN
  Administered 2019-11-02: 10:00:00 29.885 via INTRAVENOUS

## 2019-11-02 MED ORDER — REGADENOSON 0.4 MG/5ML IV SOLN
0.4000 mg | Freq: Once | INTRAVENOUS | Status: AC
  Administered 2019-11-02: 10:00:00 0.4 mg via INTRAVENOUS
  Filled 2019-11-02: qty 5

## 2019-11-18 DIAGNOSIS — M859 Disorder of bone density and structure, unspecified: Secondary | ICD-10-CM | POA: Diagnosis not present

## 2019-11-18 DIAGNOSIS — R55 Syncope and collapse: Secondary | ICD-10-CM | POA: Diagnosis not present

## 2019-11-18 DIAGNOSIS — R531 Weakness: Secondary | ICD-10-CM | POA: Diagnosis not present

## 2019-11-23 DIAGNOSIS — H401131 Primary open-angle glaucoma, bilateral, mild stage: Secondary | ICD-10-CM | POA: Diagnosis not present

## 2019-11-26 ENCOUNTER — Encounter: Payer: Self-pay | Admitting: Cardiovascular Disease

## 2019-11-26 ENCOUNTER — Other Ambulatory Visit: Payer: Self-pay

## 2019-11-26 ENCOUNTER — Ambulatory Visit (INDEPENDENT_AMBULATORY_CARE_PROVIDER_SITE_OTHER): Payer: Medicare Other | Admitting: Cardiovascular Disease

## 2019-11-26 VITALS — BP 150/70 | HR 74 | Ht 62.0 in | Wt 149.2 lb

## 2019-11-26 DIAGNOSIS — I493 Ventricular premature depolarization: Secondary | ICD-10-CM | POA: Diagnosis not present

## 2019-11-26 DIAGNOSIS — I10 Essential (primary) hypertension: Secondary | ICD-10-CM

## 2019-11-26 DIAGNOSIS — E785 Hyperlipidemia, unspecified: Secondary | ICD-10-CM

## 2019-11-26 NOTE — Patient Instructions (Signed)
Medication Instructions:  Your physician recommends that you continue on your current medications as directed. Please refer to the Current Medication list given to you today.  *If you need a refill on your cardiac medications before your next appointment, please call your pharmacy*   Lab Work: None ordered If you have labs (blood work) drawn today and your tests are completely normal, you will receive your results only by: Marland Kitchen MyChart Message (if you have MyChart) OR . A paper copy in the mail If you have any lab test that is abnormal or we need to change your treatment, we will call you to review the results.   Testing/Procedures: None ordeerd   Follow-Up: At [facility], you and your health needs are our priority.  As part of our continuing mission to provide you with exceptional heart care, we have created designated Provider Care Teams.  These Care Teams include your primary Cardiologist (physician) and Advanced Practice Providers (APPs -  Physician Assistants and Nurse Practitioners) who all work together to provide you with the care you need, when you need it.  We recommend signing up for the patient portal called "MyChart".  Sign up information is provided on this After Visit Summary.  MyChart is used to connect with patients for Virtual Visits (Telemedicine).  Patients are able to view lab/test results, encounter notes, upcoming appointments, etc.  Non-urgent messages can be sent to your provider as well.   To learn more about what you can do with MyChart, go to [email or web address].    Your next appointment:   12 month(s)  The format for your next appointment:   In Person  Provider:    You may see Dr. Fletcher Anon or one of the following Advanced Practice Providers on your designated Care Team:    Murray Hodgkins, NP  Christell Faith, [name]  Marrianne Mood, [name]    Other Instructions N/A

## 2019-11-26 NOTE — Progress Notes (Signed)
Cardiology Office Note   Date:  11/26/2019   ID:  Gina Ford, DOB 07-23-1941, MRN [id number]  PCP:  Kathyrn Lass, MD  Cardiologist:   Kathlyn Sacramento, MD   Chief Complaint  Patient presents with  . other    Follow up from [facility]. Meds reviewed by the pt. verbally. Pt. c/o chest discomfort at times.       History of Present Illness: Gina Ford is a 79 y.o. female who is here today for follow-up visit regarding PVCs.   She has known history of essential hypertension, hyperlipidemia and hypothyroidism on replacement therapy with levothyroxine. She was evaluated by our group in 2012 for atypical chest pain.  She underwent a treadmill nuclear stress test which showed no evidence of ischemia with normal ejection fraction. She was recently seen for PVCs with overall mild symptoms of palpitations and exertional dyspnea without chest pain. She underwent a 3-day ZIO monitor which showed normal sinus rhythm with an average heart rate of 71 bpm.  There were occasional PVCs with a burden of 2.7% with one 10 beat run of wide-complex tachycardia.  Due to that, I proceeded with a Lexiscan Myoview which showed no evidence of ischemia with normal ejection fraction.  Echocardiogram showed an EF of 60 to 65% with grade 1 diastolic dysfunction, mild mitral regurgitation and mild to moderate aortic regurgitation.   Past Medical History:  Diagnosis Date  . Abnormal mammogram    NEED R DIAGNO MAMMO 3/12  . Achilles tendonitis    B 2ND AND 3RD MTP SYNOVITIS DR. Beola Cord  . Actinic keratosis    HAND 6/11 KELLIE SHEFF[place], [place]  . Allergic rhinitis   . Anemia   . Arthritis   . GERD (gastroesophageal reflux disease)   . Glaucoma   . HLD (hyperlipidemia)   . HTN (hypertension)   . Hyperthyroidism   . Low serum vitamin D 01/2015  . Obesity     Past Surgical History:  Procedure Laterality Date  . COLONOSCOPY    . ESOPHAGOGASTRODUODENOSCOPY  2012, 2016  . INGUINAL HERNIA REPAIR Left    . KNEE ARTHROSCOPY    . LOBECTOMY     Right Thyroid, then Left  . THYROIDECTOMY  1991  . TUBAL LIGATION    . VARICOSE VEIN SURGERY       Current Outpatient Medications  Medication Sig Dispense Refill  . amLODipine (NORVASC) 5 MG tablet Take 5 mg by mouth daily.    . hydrocortisone (ANUSOL-HC) 2.5 % rectal cream Place 1 application rectally 2 (two) times daily as needed for hemorrhoids or itching.    . latanoprost (XALATAN) 0.005 % ophthalmic solution Place 1 drop into both eyes at bedtime.    Marland Kitchen levothyroxine (SYNTHROID, LEVOTHROID) 100 MCG tablet Take 100 mcg by mouth daily before breakfast.    . pravastatin (PRAVACHOL) 40 MG tablet Take 40 mg by mouth daily.    Marland Kitchen TIMOLOL HEMIHYDRATE OP Apply 1 drop to eye daily.    . Vitamin D, Ergocalciferol, (DRISDOL) 50000 units CAPS capsule Take 50,000 Units by mouth every 7 (seven) days.     No current facility-administered medications for this visit.    Allergies:   Doxazosin mesylate and Univasc [moexipril]    Social History:  The patient  reports that she has never smoked. She has never used smokeless tobacco. She reports current alcohol use. She reports that she does not use drugs.   Family History:  The patient's family history includes Arthritis in her  father and mother; CVA in her father and mother; Colon polyps in her mother; Heart attack in an other family member; Heart attack (age of onset: 72) in her father; Hypertension in her mother; Leukemia (age of onset: 14) in her sister; Melanoma in her sister; Stroke in her father; Stroke (age of onset: 34) in her mother.    ROS:  Please see the history of present illness.   Otherwise, review of systems are positive for none.   All other systems are reviewed and negative.    PHYSICAL EXAM: VS:  BP (!) 150/70 (BP Location: Left Arm, Patient Position: Sitting, Cuff Size: Normal)   Pulse 74   Ht 5\' 2"  (1.575 m)   Wt 149 lb 4 oz (67.7 kg)   SpO2 98%   BMI 27.30 kg/m  , BMI Body mass  index is 27.3 kg/m. GEN: Well nourished, well developed, in no acute distress  HEENT: normal  Neck: no JVD, carotid bruits, or masses Cardiac: RRR; no murmurs, rubs, or gallops,no edema  Respiratory:  clear to auscultation bilaterally, normal work of breathing GI: soft, nontender, nondistended, + BS MS: no deformity or atrophy  Skin: warm and dry, no rash Neuro:  Strength and sensation are intact Psych: euthymic mood, full affect   EKG:  EKG is ordered today. 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  Recent Labs: No results found for requested labs within last 8760 hours.    Lipid Panel No results found for: CHOL, TRIG, HDL, CHOLHDL, VLDL, LDLCALC, LDLDIRECT    Wt Readings from Last 3 Encounters:  11/26/19 149 lb 4 oz (67.7 kg)  09/29/19 145 lb 12 oz (66.1 kg)  09/21/10 148 lb (67.1 kg)        PAD Screen 09/29/2019  Previous PAD dx? No  Previous surgical procedure? No  Pain with walking? No  Feet/toe relief with dangling? No  Painful, non-healing ulcers? No  Extremities discolored? No      ASSESSMENT AND PLAN:  1.  PVCs: Overall burden is only 2.7% and is not associated with cardiomyopathy.  In addition, there is no evidence of underlying ischemic heart disease based on her recent stress test.  Given that her symptoms are overall mild.  This by itself does not require treatment at this point.  Continue to monitor and follow-up with me on a yearly basis.    2.  Mild exertional dyspnea: Negative cardiac work-up including stress test and echocardiogram.  3.  Essential hypertension: Blood pressure is mildly elevated.  Blood pressure was controlled during last visit.  Continue to monitor and consider adding an ACE inhibitor or ARB.  4.  Hyperlipidemia: Currently on pravastatin 40 mg daily.    Disposition:   FU with me in 1 year.  Signed,  Kathlyn Sacramento, MD  11/26/2019 2:16 PM    [place]

## 2019-12-07 DIAGNOSIS — M25532 Pain in left wrist: Secondary | ICD-10-CM | POA: Diagnosis not present

## 2019-12-07 DIAGNOSIS — M1990 Unspecified osteoarthritis, unspecified site: Secondary | ICD-10-CM | POA: Diagnosis not present

## 2019-12-31 DIAGNOSIS — M47816 Spondylosis without myelopathy or radiculopathy, lumbar region: Secondary | ICD-10-CM | POA: Diagnosis not present

## 2019-12-31 DIAGNOSIS — Z13820 Encounter for screening for osteoporosis: Secondary | ICD-10-CM | POA: Diagnosis not present

## 2019-12-31 DIAGNOSIS — M159 Polyosteoarthritis, unspecified: Secondary | ICD-10-CM | POA: Diagnosis not present

## 2020-01-11 DIAGNOSIS — M47816 Spondylosis without myelopathy or radiculopathy, lumbar region: Secondary | ICD-10-CM | POA: Diagnosis not present

## 2020-01-11 DIAGNOSIS — M5136 Other intervertebral disc degeneration, lumbar region: Secondary | ICD-10-CM | POA: Diagnosis not present

## 2020-01-11 DIAGNOSIS — M8588 Other specified disorders of bone density and structure, other site: Secondary | ICD-10-CM | POA: Diagnosis not present

## 2020-01-13 DIAGNOSIS — M545 Low back pain: Secondary | ICD-10-CM | POA: Diagnosis not present

## 2020-01-13 DIAGNOSIS — M6281 Muscle weakness (generalized): Secondary | ICD-10-CM | POA: Diagnosis not present

## 2020-01-20 DIAGNOSIS — M6281 Muscle weakness (generalized): Secondary | ICD-10-CM | POA: Diagnosis not present

## 2020-01-20 DIAGNOSIS — M545 Low back pain: Secondary | ICD-10-CM | POA: Diagnosis not present

## 2020-01-22 DIAGNOSIS — M545 Low back pain: Secondary | ICD-10-CM | POA: Diagnosis not present

## 2020-01-22 DIAGNOSIS — M6281 Muscle weakness (generalized): Secondary | ICD-10-CM | POA: Diagnosis not present

## 2020-01-28 DIAGNOSIS — M545 Low back pain: Secondary | ICD-10-CM | POA: Diagnosis not present

## 2020-01-28 DIAGNOSIS — M6281 Muscle weakness (generalized): Secondary | ICD-10-CM | POA: Diagnosis not present

## 2020-02-03 DIAGNOSIS — M545 Low back pain: Secondary | ICD-10-CM | POA: Diagnosis not present

## 2020-02-03 DIAGNOSIS — M6281 Muscle weakness (generalized): Secondary | ICD-10-CM | POA: Diagnosis not present

## 2020-02-05 DIAGNOSIS — M6281 Muscle weakness (generalized): Secondary | ICD-10-CM | POA: Diagnosis not present

## 2020-02-05 DIAGNOSIS — M545 Low back pain: Secondary | ICD-10-CM | POA: Diagnosis not present

## 2020-02-10 DIAGNOSIS — D649 Anemia, unspecified: Secondary | ICD-10-CM | POA: Diagnosis not present

## 2020-02-10 DIAGNOSIS — I10 Essential (primary) hypertension: Secondary | ICD-10-CM | POA: Diagnosis not present

## 2020-02-10 DIAGNOSIS — H409 Unspecified glaucoma: Secondary | ICD-10-CM | POA: Diagnosis not present

## 2020-02-10 DIAGNOSIS — E7800 Pure hypercholesterolemia, unspecified: Secondary | ICD-10-CM | POA: Diagnosis not present

## 2020-02-10 DIAGNOSIS — E039 Hypothyroidism, unspecified: Secondary | ICD-10-CM | POA: Diagnosis not present

## 2020-02-12 DIAGNOSIS — L299 Pruritus, unspecified: Secondary | ICD-10-CM | POA: Diagnosis not present

## 2020-02-12 DIAGNOSIS — M6281 Muscle weakness (generalized): Secondary | ICD-10-CM | POA: Diagnosis not present

## 2020-02-12 DIAGNOSIS — M545 Low back pain: Secondary | ICD-10-CM | POA: Diagnosis not present

## 2020-02-22 DIAGNOSIS — M47816 Spondylosis without myelopathy or radiculopathy, lumbar region: Secondary | ICD-10-CM | POA: Diagnosis not present

## 2020-02-22 DIAGNOSIS — M5136 Other intervertebral disc degeneration, lumbar region: Secondary | ICD-10-CM | POA: Diagnosis not present

## 2020-03-02 ENCOUNTER — Other Ambulatory Visit: Payer: Self-pay

## 2020-03-02 ENCOUNTER — Ambulatory Visit: Payer: Medicare Other | Admitting: Dermatology

## 2020-03-02 DIAGNOSIS — L409 Psoriasis, unspecified: Secondary | ICD-10-CM

## 2020-03-02 DIAGNOSIS — L578 Other skin changes due to chronic exposure to nonionizing radiation: Secondary | ICD-10-CM | POA: Diagnosis not present

## 2020-03-02 DIAGNOSIS — L821 Other seborrheic keratosis: Secondary | ICD-10-CM

## 2020-03-02 MED ORDER — MOMETASONE FUROATE 0.1 % EX SOLN: Freq: Every day | CUTANEOUS | 3 refills | Status: DC

## 2020-03-02 NOTE — Progress Notes (Signed)
   New Patient Visit  Subjective  Gina Ford is a 79 y.o. female who presents for the following: Rash (of scalp x 2 months that is itchy. She is using Ketoconazole 2% shampoo and apple cider vinegar which has helped and recently completed a course of prednisone. Feels like sand.).  The following portions of the chart were reviewed this encounter and updated as appropriate:  Tobacco  Allergies  Meds  Problems  Med Hx  Surg Hx  Fam Hx     Review of Systems:  No other skin or systemic complaints except as noted in HPI or Assessment and Plan.  Objective  Well appearing patient in no apparent distress; mood and affect are within normal limits.  A focused examination was performed including scalp, face, arms. Relevant physical exam findings are noted in the Assessment and Plan.    Assessment & Plan    Actinic Damage - diffuse scaly erythematous macules with underlying dyspigmentation - Recommend daily broad spectrum sunscreen SPF 30+ to sun-exposed areas, reapply every 2 hours as needed.  - Call for new or changing lesions.  Seborrheic Keratoses - Stuck-on, waxy, tan-brown papules and plaques  - Discussed benign etiology and prognosis. - Observe - Call for any changes  Psoriasis Scalp  Continue Ketoconazole 2% shampoo 3 times per week. Recommend alternating with Head and Shoulders shampoo or a tar shampoo - samples given.  Start mometasone (ELOCON) 0.1 % lotion qhs up to 5 days per week scalp - Scalp  Return in about 8 weeks (around 04/27/2020).   I, Ashok Cordia, CMA, am acting as scribe for Sarina Ser, MD .  Documentation: I have reviewed the above documentation for accuracy and completeness, and I agree with the above.  Sarina Ser, MD

## 2020-03-15 ENCOUNTER — Encounter: Payer: Self-pay | Admitting: Dermatology

## 2020-03-15 DIAGNOSIS — R921 Mammographic calcification found on diagnostic imaging of breast: Secondary | ICD-10-CM | POA: Diagnosis not present

## 2020-03-15 DIAGNOSIS — N6489 Other specified disorders of breast: Secondary | ICD-10-CM | POA: Diagnosis not present

## 2020-03-31 DIAGNOSIS — E7800 Pure hypercholesterolemia, unspecified: Secondary | ICD-10-CM | POA: Diagnosis not present

## 2020-03-31 DIAGNOSIS — E039 Hypothyroidism, unspecified: Secondary | ICD-10-CM | POA: Diagnosis not present

## 2020-03-31 DIAGNOSIS — H409 Unspecified glaucoma: Secondary | ICD-10-CM | POA: Diagnosis not present

## 2020-03-31 DIAGNOSIS — D649 Anemia, unspecified: Secondary | ICD-10-CM | POA: Diagnosis not present

## 2020-03-31 DIAGNOSIS — I10 Essential (primary) hypertension: Secondary | ICD-10-CM | POA: Diagnosis not present

## 2020-04-11 ENCOUNTER — Other Ambulatory Visit: Payer: Self-pay | Admitting: Diagnostic Radiology

## 2020-04-11 DIAGNOSIS — R921 Mammographic calcification found on diagnostic imaging of breast: Secondary | ICD-10-CM | POA: Diagnosis not present

## 2020-04-11 DIAGNOSIS — D241 Benign neoplasm of right breast: Secondary | ICD-10-CM | POA: Diagnosis not present

## 2020-05-04 ENCOUNTER — Ambulatory Visit: Payer: Medicare Other | Admitting: Dermatology

## 2020-05-04 ENCOUNTER — Other Ambulatory Visit: Payer: Self-pay

## 2020-05-04 DIAGNOSIS — L409 Psoriasis, unspecified: Secondary | ICD-10-CM

## 2020-05-04 MED ORDER — MOMETASONE FUROATE 0.1 % EX SOLN: CUTANEOUS | 3 refills | Status: DC

## 2020-05-04 MED ORDER — CLOBETASOL PROPIONATE 0.05 % EX SHAM: MEDICATED_SHAMPOO | CUTANEOUS | 2 refills | Status: DC

## 2020-05-04 NOTE — Progress Notes (Deleted)
° °  Follow-Up Visit   Subjective  Gina Ford is a 79 y.o. female who presents for the following: Psoriasis (8 weeks f/u on Psoriasis on the scalp, pt using Ketoconazole shampoo, Mometasone lotion with a poor response ).    The following portions of the chart were reviewed this encounter and updated as appropriate:      Review of Systems:  No other skin or systemic complaints except as noted in HPI or Assessment and Plan.  Objective  Well appearing patient in no apparent distress; mood and affect are within normal limits.  A focused examination was performed including face,scalp . Relevant physical exam findings are noted in the Assessment and Plan.  Objective  scalp: Very minium scale, scalp clear    Assessment & Plan  Psoriasis scalp  Psoriasis-no cure can only control  May have been flared by Covid vaccine   Start Clobetasol 0.05% shampoo 3 days a week  Cont Ketoconazole 2% shampoo 3 days a week  Cont Mometasone solution apply to itchy areas on scalp daily x 5 days a week   Ordered Medications: Clobetasol Propionate 0.05 % shampoo  Reordered Medications mometasone (ELOCON) 0.1 % lotion  Return in about 3 months (around 08/03/2020) for Psoriasis .  IMarye Round, CMA, am acting as scribe for Sarina Ser, MD .

## 2020-05-04 NOTE — Progress Notes (Signed)
   Follow-Up Visit   Subjective  Gina Ford is a 79 y.o. female who presents for the following: Psoriasis (8 weeks f/u on Psoriasis on the scalp, pt using Ketoconazole shampoo, Mometasone lotion with a poor response ). She still has a lot of itch on her scalp and some scale although the scale is improved.  The following portions of the chart were reviewed this encounter and updated as appropriate:  Tobacco  Allergies  Meds  Problems  Med Hx  Surg Hx  Fam Hx     Review of Systems:  No other skin or systemic complaints except as noted in HPI or Assessment and Plan.  Objective  Well appearing patient in no apparent distress; mood and affect are within normal limits.  A focused examination was performed including scalp. Relevant physical exam findings are noted in the Assessment and Plan.  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  Flared possibly by Covid vaccine scalp Psoriasis-no cure can only control  May have been flared by Covid vaccine   Start Clobetasol 0.05% shampoo 3 days a week  Cont Ketoconazole 2% shampoo 3 days a week  Cont Mometasone solution apply to itchy areas on scalp daily x 5 days a week   Ordered Medications: Clobetasol Propionate 0.05 % shampoo  Reordered Medications mometasone (ELOCON) 0.1 % lotion  Return in about 3 months (around 08/03/2020) for Psoriasis .  IMarye Round, CMA, am acting as scribe for Sarina Ser, MD .  Documentation: I have reviewed the above documentation for accuracy and completeness, and I agree with the above.  Sarina Ser, MD

## 2020-05-06 DIAGNOSIS — E039 Hypothyroidism, unspecified: Secondary | ICD-10-CM | POA: Diagnosis not present

## 2020-05-06 DIAGNOSIS — I10 Essential (primary) hypertension: Secondary | ICD-10-CM | POA: Diagnosis not present

## 2020-05-06 DIAGNOSIS — H409 Unspecified glaucoma: Secondary | ICD-10-CM | POA: Diagnosis not present

## 2020-05-06 DIAGNOSIS — D649 Anemia, unspecified: Secondary | ICD-10-CM | POA: Diagnosis not present

## 2020-05-06 DIAGNOSIS — D509 Iron deficiency anemia, unspecified: Secondary | ICD-10-CM | POA: Diagnosis not present

## 2020-05-09 ENCOUNTER — Encounter: Payer: Self-pay | Admitting: Dermatology

## 2020-05-25 DIAGNOSIS — H04123 Dry eye syndrome of bilateral lacrimal glands: Secondary | ICD-10-CM | POA: Diagnosis not present

## 2020-05-25 DIAGNOSIS — H401131 Primary open-angle glaucoma, bilateral, mild stage: Secondary | ICD-10-CM | POA: Diagnosis not present

## 2020-05-25 DIAGNOSIS — H26493 Other secondary cataract, bilateral: Secondary | ICD-10-CM | POA: Diagnosis not present

## 2020-05-25 DIAGNOSIS — H524 Presbyopia: Secondary | ICD-10-CM | POA: Diagnosis not present

## 2020-05-26 DIAGNOSIS — Z23 Encounter for immunization: Secondary | ICD-10-CM | POA: Diagnosis not present

## 2020-05-26 DIAGNOSIS — E039 Hypothyroidism, unspecified: Secondary | ICD-10-CM | POA: Diagnosis not present

## 2020-05-26 DIAGNOSIS — D509 Iron deficiency anemia, unspecified: Secondary | ICD-10-CM | POA: Diagnosis not present

## 2020-05-26 DIAGNOSIS — I10 Essential (primary) hypertension: Secondary | ICD-10-CM | POA: Diagnosis not present

## 2020-05-26 DIAGNOSIS — H409 Unspecified glaucoma: Secondary | ICD-10-CM | POA: Diagnosis not present

## 2020-06-24 DIAGNOSIS — E039 Hypothyroidism, unspecified: Secondary | ICD-10-CM | POA: Diagnosis not present

## 2020-06-24 DIAGNOSIS — E7800 Pure hypercholesterolemia, unspecified: Secondary | ICD-10-CM | POA: Diagnosis not present

## 2020-06-24 DIAGNOSIS — D509 Iron deficiency anemia, unspecified: Secondary | ICD-10-CM | POA: Diagnosis not present

## 2020-06-24 DIAGNOSIS — E559 Vitamin D deficiency, unspecified: Secondary | ICD-10-CM | POA: Diagnosis not present

## 2020-07-04 DIAGNOSIS — D509 Iron deficiency anemia, unspecified: Secondary | ICD-10-CM | POA: Diagnosis not present

## 2020-07-11 DIAGNOSIS — R1032 Left lower quadrant pain: Secondary | ICD-10-CM | POA: Diagnosis not present

## 2020-07-11 DIAGNOSIS — D509 Iron deficiency anemia, unspecified: Secondary | ICD-10-CM | POA: Diagnosis not present

## 2020-07-11 DIAGNOSIS — K219 Gastro-esophageal reflux disease without esophagitis: Secondary | ICD-10-CM | POA: Diagnosis not present

## 2020-08-04 ENCOUNTER — Other Ambulatory Visit: Payer: Self-pay

## 2020-08-04 ENCOUNTER — Ambulatory Visit: Payer: Medicare Other | Admitting: Dermatology

## 2020-08-04 DIAGNOSIS — L720 Epidermal cyst: Secondary | ICD-10-CM | POA: Diagnosis not present

## 2020-08-04 DIAGNOSIS — L408 Other psoriasis: Secondary | ICD-10-CM | POA: Diagnosis not present

## 2020-08-04 DIAGNOSIS — L821 Other seborrheic keratosis: Secondary | ICD-10-CM

## 2020-08-04 DIAGNOSIS — L409 Psoriasis, unspecified: Secondary | ICD-10-CM

## 2020-08-04 NOTE — Patient Instructions (Signed)
   Pre-Operative Instructions  You are scheduled for a surgical procedure at [facility]. We recommend you read the following instructions. If you have any questions or concerns, please call the office at [phone].  1. Shower and wash the entire body with soap and water the day of your surgery paying special attention to cleansing at and around the planned surgery site.  2. Avoid aspirin or aspirin containing products at least fourteen (14) days prior to your surgical procedure and for at least one week (7 Days) after your surgical procedure. If you take aspirin on a regular basis for heart disease or history of stroke or for any other reason, we may recommend you continue taking aspirin but please notify us if you take this on a regular basis. Aspirin can cause more bleeding to occur during surgery as well as prolonged bleeding and bruising after surgery.   3. Avoid other nonsteroidal pain medications at least one week prior to surgery and at least one week prior to your surgery. These include medications such as Ibuprofen (Motrin, Advil and Nuprin), Naprosyn, Voltaren, Relafen, etc. If medications are used for therapeutic reasons, please inform us as they can cause increased bleeding or prolonged bleeding during and bruising after surgical procedures.   4. Please advise us if you are taking any "blood thinner" medications such as Coumadin or Dipyridamole or Plavix or similar medications. These cause increased bleeding and prolonged bleeding during procedures and bruising after surgical procedures. We may have to consider discontinuing these medications briefly prior to and shortly after your surgery if safe to do so.   5. Please inform us of all medications you are currently taking. All medications that are taken regularly should be taken the day of surgery as you always do. Nevertheless, we need to be informed of what medications you are taking prior to surgery to know whether they will  affect the procedure or cause any complications.   6. Please inform us of any medication allergies. Also inform us of whether you have allergies to Latex or rubber products or whether you have had any adverse reaction to Lidocaine or Epinephrine.  7. Please inform us of any prosthetic or artificial body parts such as artificial heart valve, joint replacements, etc., or similar condition that might require preoperative antibiotics.   8. We recommend avoidance of alcohol at least two weeks prior to surgery and continued avoidance for at least two weeks after surgery.   9. We recommend discontinuation of tobacco smoking at least two weeks prior to surgery and continued abstinence for at least two weeks after surgery.  10. Do not plan strenuous exercise, strenuous work or strenuous lifting for approximately four weeks after your surgery.   11. We request if you are unable to make your scheduled surgical appointment, please call us at least a week in advance or as soon as you are aware of a problem so that we can cancel or reschedule the appointment.   12. You MAY TAKE TYLENOL (acetaminophen) for pain as it is not a blood thinner.   13. PLEASE PLAN TO BE IN TOWN FOR TWO WEEKS FOLLOWING SURGERY, THIS IS IMPORTANT SO YOU CAN BE CHECKED FOR DRESSING CHANGES, SUTURE REMOVAL AND TO MONITOR FOR POSSIBLE COMPLICATIONS.   

## 2020-08-04 NOTE — Progress Notes (Signed)
   Follow-Up Visit   Subjective  Gina Ford is a 79 y.o. female who presents for the following: Psoriasis (of the scalp ). Patient is currently using one of the shampoos but she is unsure which one, Mometasone solution QD 5d/wk, and tar shampoo.  The following portions of the chart were reviewed this encounter and updated as appropriate:  Tobacco  Allergies  Meds  Problems  Med Hx  Surg Hx  Fam Hx     Review of Systems:  No other skin or systemic complaints except as noted in HPI or Assessment and Plan.  Objective  Well appearing patient in no apparent distress; mood and affect are within normal limits.  A focused examination was performed including the scalp . Relevant physical exam findings are noted in the Assessment and Plan.  Objective  Scalp, eyebrows: Scalp clear   Objective  R lat lower eyelid: 0.6 cm Firm SQ nodule   Assessment & Plan  Psoriasis Scalp, eyebrows With sebopsoriasis Persistent with itch but improved. Continue "pink shampoo" (Ketoconazole shampoo) 3d/wk to the brows and scalp. Continue Tar shampoo on the days not using the "pink shampoo" to the brows and scalp.  Continue Mometasone solution to aa's brows and scalp QD 5d/wk.  Psoriasis is a chronic non-curable, but treatable genetic/hereditary disease that may have other systemic features affecting other organ systems such as joints (Psoriatic Arthritis). It is associated with an increased risk of inflammatory bowel disease, heart disease, non-alcoholic fatty liver disease, and depression.    Other Related Medications Clobetasol Propionate 0.05 % shampoo mometasone (ELOCON) 0.1 % lotion  Epidermal inclusion cyst R lat lower eyelid Schedule for surgery at 4:30 on a Tuesday.   Seborrheic Keratoses - Stuck-on, waxy, tan-brown papules and plaques  - Discussed benign etiology and prognosis. - Observe - Call for any changes  Return for surgery - R lower eyelid on a Tuesday at 4:30pm, follow  up appt. at the same time.  Luther Redo, CMA, am acting as scribe for Sarina Ser, MD .  Documentation: I have reviewed the above documentation for accuracy and completeness, and I agree with the above.  Sarina Ser, MD

## 2020-08-10 ENCOUNTER — Encounter: Payer: Self-pay | Admitting: Dermatology

## 2020-08-24 DIAGNOSIS — G5792 Unspecified mononeuropathy of left lower limb: Secondary | ICD-10-CM | POA: Diagnosis not present

## 2020-08-24 DIAGNOSIS — M2041 Other hammer toe(s) (acquired), right foot: Secondary | ICD-10-CM | POA: Diagnosis not present

## 2020-08-24 DIAGNOSIS — M2012 Hallux valgus (acquired), left foot: Secondary | ICD-10-CM | POA: Diagnosis not present

## 2020-08-24 DIAGNOSIS — M2011 Hallux valgus (acquired), right foot: Secondary | ICD-10-CM | POA: Diagnosis not present

## 2020-08-25 DIAGNOSIS — Z1159 Encounter for screening for other viral diseases: Secondary | ICD-10-CM | POA: Diagnosis not present

## 2020-08-30 DIAGNOSIS — D509 Iron deficiency anemia, unspecified: Secondary | ICD-10-CM | POA: Diagnosis not present

## 2020-08-30 DIAGNOSIS — K2930 Chronic superficial gastritis without bleeding: Secondary | ICD-10-CM | POA: Diagnosis not present

## 2020-08-30 DIAGNOSIS — K640 First degree hemorrhoids: Secondary | ICD-10-CM | POA: Diagnosis not present

## 2020-08-30 DIAGNOSIS — K5730 Diverticulosis of large intestine without perforation or abscess without bleeding: Secondary | ICD-10-CM | POA: Diagnosis not present

## 2020-08-30 DIAGNOSIS — K449 Diaphragmatic hernia without obstruction or gangrene: Secondary | ICD-10-CM | POA: Diagnosis not present

## 2020-08-30 DIAGNOSIS — K219 Gastro-esophageal reflux disease without esophagitis: Secondary | ICD-10-CM | POA: Diagnosis not present

## 2020-09-01 DIAGNOSIS — K2930 Chronic superficial gastritis without bleeding: Secondary | ICD-10-CM | POA: Diagnosis not present

## 2020-09-20 DIAGNOSIS — N390 Urinary tract infection, site not specified: Secondary | ICD-10-CM | POA: Diagnosis not present

## 2020-09-23 DIAGNOSIS — N12 Tubulo-interstitial nephritis, not specified as acute or chronic: Secondary | ICD-10-CM | POA: Diagnosis not present

## 2020-09-26 DIAGNOSIS — G4700 Insomnia, unspecified: Secondary | ICD-10-CM | POA: Diagnosis not present

## 2020-09-26 DIAGNOSIS — E7800 Pure hypercholesterolemia, unspecified: Secondary | ICD-10-CM | POA: Diagnosis not present

## 2020-09-26 DIAGNOSIS — M1990 Unspecified osteoarthritis, unspecified site: Secondary | ICD-10-CM | POA: Diagnosis not present

## 2020-09-26 DIAGNOSIS — M8580 Other specified disorders of bone density and structure, unspecified site: Secondary | ICD-10-CM | POA: Diagnosis not present

## 2020-09-26 DIAGNOSIS — D509 Iron deficiency anemia, unspecified: Secondary | ICD-10-CM | POA: Diagnosis not present

## 2020-09-26 DIAGNOSIS — H409 Unspecified glaucoma: Secondary | ICD-10-CM | POA: Diagnosis not present

## 2020-09-26 DIAGNOSIS — K219 Gastro-esophageal reflux disease without esophagitis: Secondary | ICD-10-CM | POA: Diagnosis not present

## 2020-09-26 DIAGNOSIS — I10 Essential (primary) hypertension: Secondary | ICD-10-CM | POA: Diagnosis not present

## 2020-09-26 DIAGNOSIS — E039 Hypothyroidism, unspecified: Secondary | ICD-10-CM | POA: Diagnosis not present

## 2020-09-26 DIAGNOSIS — D649 Anemia, unspecified: Secondary | ICD-10-CM | POA: Diagnosis not present

## 2020-10-12 ENCOUNTER — Telehealth: Payer: Self-pay | Admitting: Cardiovascular Disease

## 2020-10-12 NOTE — Telephone Encounter (Signed)
Pt has been scheduled with Dr. Fletcher Anon on 10/13/20 @ 3 pm.  Spoke with the patient. She confirmed that she is taking the listed Amlodipine 5 mg daily. She has not kept a BP log but she has been checking her BP regularly at home and it has been consistently elevated. Advised her to bring her BP machine with her to her appt tomorrow so that it can be checked for accuracy.  Patient verbalized understanding and voiced appreciation for the call.

## 2020-10-12 NOTE — Telephone Encounter (Signed)
Pt c/o BP issue: STAT if pt c/o blurred vision, one-sided weakness or slurred speech  1. What are your last 5 BP readings? 194/84  180/79  Normal for patient is 140/70   2. Are you having any other symptoms (ex. Dizziness, headache, blurred vision, passed out)? Left arm pain elbow to wrist feels funny a little unstable   3. What is your BP issue? Elevated recent kidney infection after pain controlled bp did not trend down elevated x 3 weeks

## 2020-10-13 ENCOUNTER — Ambulatory Visit: Payer: Medicare Other | Admitting: Cardiovascular Disease

## 2020-10-13 ENCOUNTER — Other Ambulatory Visit: Payer: Self-pay

## 2020-10-13 ENCOUNTER — Encounter: Payer: Self-pay | Admitting: Cardiovascular Disease

## 2020-10-13 VITALS — BP 172/92 | HR 80 | Ht 62.0 in | Wt 147.0 lb

## 2020-10-13 DIAGNOSIS — I10 Essential (primary) hypertension: Secondary | ICD-10-CM | POA: Diagnosis not present

## 2020-10-13 DIAGNOSIS — E785 Hyperlipidemia, unspecified: Secondary | ICD-10-CM

## 2020-10-13 DIAGNOSIS — I493 Ventricular premature depolarization: Secondary | ICD-10-CM | POA: Diagnosis not present

## 2020-10-13 MED ORDER — LOSARTAN POTASSIUM 50 MG PO TABS: 50.0000 mg | ORAL_TABLET | Freq: Every day | ORAL | 5 refills | Status: DC

## 2020-10-13 NOTE — Patient Instructions (Signed)
Medication Instructions:  Your physician has recommended you make the following change in your medication:   START Losartan 50 mg daily. An Rx has been sent to your pharmacy.  *If you need a refill on your cardiac medications before your next appointment, please call your pharmacy*   Lab Work: CMP, Cbc, Tsh today.  If you have labs (blood work) drawn today and your tests are completely normal, you will receive your results only by: Marland Kitchen MyChart Message (if you have MyChart) OR . A paper copy in the mail If you have any lab test that is abnormal or we need to change your treatment, we will call you to review the results.   Testing/Procedures: None ordered   Follow-Up: At [facility], you and your health needs are our priority.  As part of our continuing mission to provide you with exceptional heart care, we have created designated Provider Care Teams.  These Care Teams include your primary Cardiologist (physician) and Advanced Practice Providers (APPs -  Physician Assistants and Nurse Practitioners) who all work together to provide you with the care you need, when you need it.  We recommend signing up for the patient portal called "MyChart".  Sign up information is provided on this After Visit Summary.  MyChart is used to connect with patients for Virtual Visits (Telemedicine).  Patients are able to view lab/test results, encounter notes, upcoming appointments, etc.  Non-urgent messages can be sent to your provider as well.   To learn more about what you can do with MyChart, go to [email or web address].    Your next appointment:   4 week(s)  The format for your next appointment:   In Person  Provider:   You may see Kathlyn Sacramento, MD or one of the following Advanced Practice Providers on your designated Care Team:    Murray Hodgkins, NP  Christell Faith, [name]  Marrianne Mood, [name]  Cadence Kathlen Mody, [place]  Laurann Montana, NP    Other Instructions Your physician has  requested that you regularly monitor and record your blood pressure readings at home daily. Please use the same machine at the same time of day to check your readings and record them.  -- Please call or send a mychart message with your BP reading in 1 week --

## 2020-10-13 NOTE — Progress Notes (Signed)
Cardiology Office Note   Date:  10/13/2020   ID:  Gina Ford, Gina Ford 09/21/1940, MRN [id number]  PCP:  Kathyrn Lass, MD  Cardiologist:   Kathlyn Sacramento, MD   Chief Complaint  Patient presents with  . Follow-up    Elevated BP per telephone note from yesterday      History of Present Illness: Gina Ford is a 80 y.o. female who is here today for follow-up visit regarding PVCs and uncontrolled hypertension.   She has known history of essential hypertension, hyperlipidemia and hypothyroidism on replacement therapy with levothyroxine. She was evaluated by our group in 2012 for atypical chest pain.  She underwent a treadmill nuclear stress test which showed no evidence of ischemia with normal ejection fraction. She was seen last year for mild palpitations as well as exertional dyspnea. She underwent a 3-day ZIO monitor which showed normal sinus rhythm with an average heart rate of 71 bpm.  There were occasional PVCs with a burden of 2.7% with one 10 beat run of wide-complex tachycardia.  Due to that, I proceeded with a Lexiscan Myoview which showed no evidence of ischemia with normal ejection fraction.  Echocardiogram showed an EF of 60 to 65% with Ford 1 diastolic dysfunction, mild mitral regurgitation and mild to moderate aortic regurgitation.  She is here today due to elevated blood pressure recently.  She checks her blood pressure at home and it has been consistently above 150 mmHg.  She had recent pyelonephritis that was treated with antibiotics.  She tried hydrochlorothiazide 12.5 mg in the past but reported no good response.  No chest pain or worsening dyspnea.  Past Medical History:  Diagnosis Date  . Abnormal mammogram    NEED R DIAGNO MAMMO 3/12  . Achilles tendonitis    B 2ND AND 3RD MTP SYNOVITIS DR. Beola Cord  . Actinic keratosis    HAND 6/11 KELLIE SHEFFIELD, [place]  . Allergic rhinitis   . Anemia   . Arthritis   . GERD (gastroesophageal reflux disease)   . Glaucoma    . HLD (hyperlipidemia)   . HTN (hypertension)   . Hyperthyroidism   . Low serum vitamin D 01/2015  . Obesity     Past Surgical History:  Procedure Laterality Date  . COLONOSCOPY    . ESOPHAGOGASTRODUODENOSCOPY  2012, 2016  . INGUINAL HERNIA REPAIR Left   . KNEE ARTHROSCOPY    . LOBECTOMY     Right Thyroid, then Left  . THYROIDECTOMY  1991  . TUBAL LIGATION    . VARICOSE VEIN SURGERY       Current Outpatient Medications  Medication Sig Dispense Refill  . amLODipine (NORVASC) 5 MG tablet Take 5 mg by mouth daily.    . Clobetasol Propionate 0.05 % shampoo Shampoo 3 days a week 118 mL 2  . hydrocortisone (ANUSOL-HC) 2.5 % rectal cream Place 1 application rectally 2 (two) times daily as needed for hemorrhoids or itching.    . latanoprost (XALATAN) 0.005 % ophthalmic solution Place 1 drop into both eyes at bedtime.    Marland Kitchen levothyroxine (SYNTHROID, LEVOTHROID) 100 MCG tablet Take 100 mcg by mouth daily before breakfast.    . mometasone (ELOCON) 0.1 % lotion Apply to itchy areas on the scalp daily x 5 days 60 mL 3  . pravastatin (PRAVACHOL) 40 MG tablet Take 40 mg by mouth daily.    Marland Kitchen TIMOLOL HEMIHYDRATE OP Apply 1 drop to eye daily.    . Vitamin D, Ergocalciferol, (DRISDOL) 50000 units  CAPS capsule Take 50,000 Units by mouth every 7 (seven) days.     No current facility-administered medications for this visit.    Allergies:   Doxazosin mesylate and Univasc [moexipril]    Social History:  The patient  reports that she has never smoked. She has never used smokeless tobacco. She reports current alcohol use. She reports that she does not use drugs.   Family History:  The patient's family history includes Arthritis in her father and mother; CVA in her father and mother; Colon polyps in her mother; Heart attack in an other family member; Heart attack (age of onset: 43) in her father; Hypertension in her mother; Leukemia (age of onset: 46) in her sister; Melanoma in her sister; Stroke in  her father; Stroke (age of onset: 24) in her mother.    ROS:  Please see the history of present illness.   Otherwise, review of systems are positive for none.   All other systems are reviewed and negative.    PHYSICAL EXAM: VS:  BP (!) 172/92 Comment: right arm  Pulse 80   Ht 5\' 2"  (1.575 m)   Wt 147 lb (66.7 kg)   BMI 26.89 kg/m  , BMI Body mass index is 26.89 kg/m. GEN: Well nourished, well developed, in no acute distress  HEENT: normal  Neck: no JVD, carotid bruits, or masses Cardiac: RRR; no murmurs, rubs, or gallops,no edema  Respiratory:  clear to auscultation bilaterally, normal work of breathing GI: soft, nontender, nondistended, + BS MS: no deformity or atrophy  Skin: warm and dry, no rash Neuro:  Strength and sensation are intact Psych: euthymic mood, full affect   EKG:  EKG is ordered today. 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  No significant change compared to before.  Recent Labs: No results found for requested labs within last 8760 hours.    Lipid Panel No results found for: CHOL, TRIG, HDL, CHOLHDL, VLDL, LDLCALC, LDLDIRECT    Wt Readings from Last 3 Encounters:  10/13/20 147 lb (66.7 kg)  11/26/19 149 lb 4 oz (67.7 kg)  09/29/19 145 lb 12 oz (66.1 kg)        PAD Screen 09/29/2019  Previous PAD dx? No  Previous surgical procedure? No  Pain with walking? No  Feet/toe relief with dangling? No  Painful, non-healing ulcers? No  Extremities discolored? No      ASSESSMENT AND PLAN:  1.  Asymptomatic PVCs: Overall burden was relatively low and she is currently with no symptoms.  Continue observation.  2.  Uncontrolled essential hypertension: Blood pressure has been not controlled recently with significant elevation.  She is only on amlodipine 5 mg once daily.  She has not had any routine labs done this year and I requested CBC, CMP and TSH. I elected to add losartan 50 mg once daily.   She is to call us back in 1 week to update [place] about blood pressure.  3.  Hyperlipidemia: Currently on pravastatin 40 mg daily.  4.  Hypothyroidism on levothyroxine.  Check TSH given elevated blood pressure.    Disposition:   FU in our office in 1 month.  Signed,  Kathlyn Sacramento, MD  10/13/2020 2:55 PM    [place]

## 2020-10-14 ENCOUNTER — Telehealth: Payer: Self-pay

## 2020-10-14 LAB — CBC WITH DIFFERENTIAL/PLATELET
Basophils Absolute: 0.1 10*3/uL (ref 0.0–0.2)
Basos: 1 %
EOS (ABSOLUTE): 0.2 10*3/uL (ref 0.0–0.4)
Eos: 2 %
Hematocrit: 39.7 % (ref 34.0–46.6)
Hemoglobin: 13.2 g/dL (ref 11.1–15.9)
Immature Grans (Abs): 0 10*3/uL (ref 0.0–0.1)
Immature Granulocytes: 0 %
Lymphocytes Absolute: 2.1 10*3/uL (ref 0.7–3.1)
Lymphs: 31 %
MCH: 28.6 pg (ref 26.6–33.0)
MCHC: 33.2 g/dL (ref 31.5–35.7)
MCV: 86 fL (ref 79–97)
Monocytes Absolute: 0.5 10*3/uL (ref 0.1–0.9)
Monocytes: 8 %
Neutrophils Absolute: 3.7 10*3/uL (ref 1.4–7.0)
Neutrophils: 58 %
Platelets: 370 10*3/uL (ref 150–450)
RBC: 4.61 x10E6/uL (ref 3.77–5.28)
RDW: 21.5 % — ABNORMAL HIGH (ref 11.7–15.4)
WBC: 6.6 10*3/uL (ref 3.4–10.8)

## 2020-10-14 LAB — COMPREHENSIVE METABOLIC PANEL
ALT: 15 IU/L (ref 0–32)
AST: 22 IU/L (ref 0–40)
Albumin/Globulin Ratio: 1.6 (ref 1.2–2.2)
Albumin: 4.1 g/dL (ref 3.7–4.7)
Alkaline Phosphatase: 78 IU/L (ref 44–121)
BUN/Creatinine Ratio: 14 (ref 12–28)
BUN: 11 mg/dL (ref 8–27)
Bilirubin Total: 0.2 mg/dL (ref 0.0–1.2)
CO2: 21 mmol/L (ref 20–29)
Calcium: 9.8 mg/dL (ref 8.7–10.3)
Chloride: 103 mmol/L (ref 96–106)
Creatinine, Ser: 0.79 mg/dL (ref 0.57–1.00)
GFR calc Af Amer: 83 mL/min/{1.73_m2} (ref >59)
GFR calc non Af Amer: 72 mL/min/{1.73_m2} (ref >59)
Globulin, Total: 2.5 g/dL (ref 1.5–4.5)
Glucose: 107 mg/dL — ABNORMAL HIGH (ref 65–99)
Potassium: 4.7 mmol/L (ref 3.5–5.2)
Sodium: 140 mmol/L (ref 134–144)
Total Protein: 6.6 g/dL (ref 6.0–8.5)

## 2020-10-14 LAB — TSH: TSH: 2.2 u[IU]/mL (ref 0.450–4.500)

## 2020-10-14 NOTE — Telephone Encounter (Signed)
-----   Message from Wellington Hampshire, MD sent at 10/14/2020  2:21 PM EST ----- Inform patient that labs were normal.  The hemolyzed potassium results you looked at are from 10 years ago.

## 2020-10-17 DIAGNOSIS — Z01812 Encounter for preprocedural laboratory examination: Secondary | ICD-10-CM | POA: Diagnosis not present

## 2020-10-18 ENCOUNTER — Other Ambulatory Visit: Payer: Self-pay

## 2020-10-18 ENCOUNTER — Ambulatory Visit: Payer: Medicare Other | Admitting: Dermatology

## 2020-10-18 ENCOUNTER — Encounter: Payer: Self-pay | Admitting: Dermatology

## 2020-10-18 DIAGNOSIS — D22112 Melanocytic nevi of right lower eyelid, including canthus: Secondary | ICD-10-CM | POA: Diagnosis not present

## 2020-10-18 DIAGNOSIS — D485 Neoplasm of uncertain behavior of skin: Secondary | ICD-10-CM

## 2020-10-18 NOTE — Progress Notes (Signed)
   Follow-Up Visit   Subjective  Gina Ford is a 80 y.o. female who presents for the following: Cyst (R lat lower eyelid, pt presents for excision).  The following portions of the chart were reviewed this encounter and updated as appropriate:   Tobacco  Allergies  Meds  Problems  Med Hx  Surg Hx  Fam Hx     Review of Systems:  No other skin or systemic complaints except as noted in HPI or Assessment and Plan.  Objective  Well appearing patient in no apparent distress; mood and affect are within normal limits.  A focused examination was performed including face. Relevant physical exam findings are noted in the Assessment and Plan.  Objective  Right lateral lower eyelid: 0.5 cm flesh colored papule   Assessment & Plan  Neoplasm of uncertain behavior of skin Right lateral lower eyelid  Skin excision  Lesion length (cm):  0.5 Lesion width (cm):  0.5 Margin per side (cm):  0.1 Total excision diameter (cm):  0.7 Informed consent: discussed and consent obtained   Timeout: patient name, date of birth, surgical site, and procedure verified   Procedure prep:  Patient was prepped and draped in usual sterile fashion Prep type:  Isopropyl alcohol and povidone-iodine Anesthesia: the lesion was anesthetized in a standard fashion   Anesthetic:  1% lidocaine w/ epinephrine 1-100,000 buffered w/ 8.4% NaHCO3 Instrument used: scissors   Hemostasis achieved with: pressure and aluminum chloride   Hemostasis achieved with comment:  Electrocautery Outcome: patient tolerated procedure well with no complications   Post-procedure details: sterile dressing applied and wound care instructions given   Dressing type: bandage and pressure dressing (mupirocin)    Specimen 1 - Surgical pathology Differential Diagnosis: Cyst vs nevus vs hydrocystoma vs other  Check Margins: No 0.5 cm flesh colored papule **2 pieces**  Return as needed.  Discussed if abnormal pathology this area may need  additional procedure.  She understands.Doylene Bode, CMA, am acting as scribe for Sarina Ser, MD .  Documentation: I have reviewed the above documentation for accuracy and completeness, and I agree with the above.  Sarina Ser, MD

## 2020-10-19 DIAGNOSIS — Z0000 Encounter for general adult medical examination without abnormal findings: Secondary | ICD-10-CM | POA: Diagnosis not present

## 2020-10-19 DIAGNOSIS — D509 Iron deficiency anemia, unspecified: Secondary | ICD-10-CM | POA: Diagnosis not present

## 2020-10-24 ENCOUNTER — Telehealth: Payer: Self-pay

## 2020-10-24 NOTE — Telephone Encounter (Signed)
LM on VM please return my call  

## 2020-10-24 NOTE — Telephone Encounter (Signed)
-----   Message from Ralene Bathe, MD sent at 10/20/2020  6:24 PM EST ----- Diagnosis Skin , right lateral lower eyelid MELANOCYTIC NEVUS, INTRADERMAL TYPE  Benign mole

## 2020-10-25 ENCOUNTER — Telehealth: Payer: Self-pay

## 2020-10-25 NOTE — Telephone Encounter (Signed)
Patient advised of biopsy results.

## 2020-10-25 NOTE — Telephone Encounter (Signed)
-----   Message from Ralene Bathe, MD sent at 10/20/2020  6:24 PM EST ----- Diagnosis Skin , right lateral lower eyelid MELANOCYTIC NEVUS, INTRADERMAL TYPE  Benign mole

## 2020-10-26 ENCOUNTER — Telehealth: Payer: Self-pay | Admitting: Cardiovascular Disease

## 2020-10-26 DIAGNOSIS — I10 Essential (primary) hypertension: Secondary | ICD-10-CM

## 2020-10-26 NOTE — Telephone Encounter (Signed)
I called and spoke with the patient.  I confirmed with her that she is taking losartan 50 mg once daily. She states she typically has been taking this at night and then checking her BP ~ 2-3 pm.  The readings reported below are from that time of day.  The patient states she noticed a "sun" on her medication bottle this morning and decided to switch taking this to the morning time.   Today is the first day she has taken this in the morning.  I have advised the patient that we typically like her to take her medication, then wait about an hour or so and take a BP reading. The patient advised she will start doing this.  She is aware I will forward her reported BP readings from today to Dr. Fletcher Anon and that we will call back with any further recommendations in the interim.  The patient voices understanding and is agreeable.

## 2020-10-26 NOTE — Telephone Encounter (Signed)
Patient calling back with BP logs after recent med change   138/67 lowest   173/85 highest   Trending 150/70 avg

## 2020-10-27 DIAGNOSIS — M1990 Unspecified osteoarthritis, unspecified site: Secondary | ICD-10-CM | POA: Diagnosis not present

## 2020-10-27 DIAGNOSIS — E7800 Pure hypercholesterolemia, unspecified: Secondary | ICD-10-CM | POA: Diagnosis not present

## 2020-10-27 DIAGNOSIS — I10 Essential (primary) hypertension: Secondary | ICD-10-CM | POA: Diagnosis not present

## 2020-10-27 DIAGNOSIS — D649 Anemia, unspecified: Secondary | ICD-10-CM | POA: Diagnosis not present

## 2020-10-27 DIAGNOSIS — H409 Unspecified glaucoma: Secondary | ICD-10-CM | POA: Diagnosis not present

## 2020-10-27 DIAGNOSIS — E039 Hypothyroidism, unspecified: Secondary | ICD-10-CM | POA: Diagnosis not present

## 2020-10-27 DIAGNOSIS — M8580 Other specified disorders of bone density and structure, unspecified site: Secondary | ICD-10-CM | POA: Diagnosis not present

## 2020-10-27 DIAGNOSIS — D509 Iron deficiency anemia, unspecified: Secondary | ICD-10-CM | POA: Diagnosis not present

## 2020-10-27 DIAGNOSIS — K219 Gastro-esophageal reflux disease without esophagitis: Secondary | ICD-10-CM | POA: Diagnosis not present

## 2020-10-27 DIAGNOSIS — G4700 Insomnia, unspecified: Secondary | ICD-10-CM | POA: Diagnosis not present

## 2020-10-28 MED ORDER — LOSARTAN POTASSIUM 100 MG PO TABS: 100.0000 mg | ORAL_TABLET | Freq: Every day | ORAL | 5 refills | Status: DC

## 2020-10-28 NOTE — Telephone Encounter (Signed)
Increase losartan to 100 mg once daily and check basic metabolic profile on Monday.

## 2020-10-28 NOTE — Telephone Encounter (Signed)
Patient made aware of Dr. Tyrell Antonio response and recommendation. She is agreeable with the plan. Rx for losartan 100 mg daily sent to the patients pharmacy. [facility] Order for bmp placed.  Patient verbalized understanding to the instructions given and voiced appreciation for the call.

## 2020-10-31 ENCOUNTER — Other Ambulatory Visit
Admission: RE | Admit: 2020-10-31 | Discharge: 2020-10-31 | Disposition: A | Discharge status: Home / Self Care | Payer: Medicare Other | Source: Ambulatory Visit | Attending: Cardiovascular Disease | Admitting: Cardiovascular Disease

## 2020-10-31 DIAGNOSIS — I10 Essential (primary) hypertension: Secondary | ICD-10-CM | POA: Diagnosis not present

## 2020-10-31 LAB — BASIC METABOLIC PANEL
Anion gap: 7 (ref 5–15)
BUN: 14 mg/dL (ref 8–23)
CO2: 27 mmol/L (ref 22–32)
Calcium: 9 mg/dL (ref 8.9–10.3)
Chloride: 106 mmol/L (ref 98–111)
Creatinine, Ser: 0.71 mg/dL (ref 0.44–1.00)
GFR, Estimated: >60 mL/min (ref >60)
Glucose, Bld: 118 mg/dL — ABNORMAL HIGH (ref 70–99)
Potassium: 4.3 mmol/L (ref 3.5–5.1)
Sodium: 140 mmol/L (ref 135–145)

## 2020-11-09 ENCOUNTER — Other Ambulatory Visit: Payer: Self-pay | Admitting: Cardiovascular Disease

## 2020-11-09 NOTE — Telephone Encounter (Signed)
Losartan Potassium 100 mg on backorder. Pharmacy requesting alternative. Please advise. Thanks!

## 2020-11-10 ENCOUNTER — Ambulatory Visit: Payer: Medicare Other | Admitting: Cardiovascular Disease

## 2020-11-10 ENCOUNTER — Other Ambulatory Visit: Payer: Self-pay

## 2020-11-10 ENCOUNTER — Encounter: Payer: Self-pay | Admitting: Cardiovascular Disease

## 2020-11-10 ENCOUNTER — Other Ambulatory Visit: Payer: Self-pay | Admitting: Cardiovascular Disease

## 2020-11-10 VITALS — BP 178/68 | HR 67 | Ht 62.0 in | Wt 143.0 lb

## 2020-11-10 DIAGNOSIS — E785 Hyperlipidemia, unspecified: Secondary | ICD-10-CM

## 2020-11-10 DIAGNOSIS — I10 Essential (primary) hypertension: Secondary | ICD-10-CM | POA: Diagnosis not present

## 2020-11-10 DIAGNOSIS — E039 Hypothyroidism, unspecified: Secondary | ICD-10-CM | POA: Diagnosis not present

## 2020-11-10 DIAGNOSIS — I493 Ventricular premature depolarization: Secondary | ICD-10-CM

## 2020-11-10 MED ORDER — VALSARTAN-HYDROCHLOROTHIAZIDE 320-12.5 MG PO TABS: 1.0000 | ORAL_TABLET | Freq: Every day | ORAL | 5 refills | Status: DC

## 2020-11-10 NOTE — Patient Instructions (Addendum)
Medication Instructions:  Your physician has recommended you make the following change in your medication:   1) STOP Losartan since it is on back order with your pharmacy.  2) START Valsartan HCTZ 320-12.5 mg daily. An Rx has been sent to your pharmacy.  *If you need a refill on your cardiac medications before your next appointment, please call your pharmacy*   Lab Work: Your physician recommends that you return for lab work (bmet) same day as your test  If you have labs (blood work) drawn today and your tests are completely normal, you will receive your results only by: Marland Kitchen MyChart Message (if you have MyChart) OR . A paper copy in the mail If you have any lab test that is abnormal or we need to change your treatment, we will call you to review the results.   Testing/Procedures: Your physician has requested that you have a renal artery duplex. During this test, an ultrasound is used to evaluate blood flow to the kidneys. Allow one hour for this exam. Do not eat after midnight the day before and avoid carbonated beverages. Take your medications as you usually do.     Follow-Up: At [facility], you and your health needs are our priority.  As part of our continuing mission to provide you with exceptional heart care, we have created designated Provider Care Teams.  These Care Teams include your primary Cardiologist (physician) and Advanced Practice Providers (APPs -  Physician Assistants and Nurse Practitioners) who all work together to provide you with the care you need, when you need it.  We recommend signing up for the patient portal called "MyChart".  Sign up information is provided on this After Visit Summary.  MyChart is used to connect with patients for Virtual Visits (Telemedicine).  Patients are able to view lab/test results, encounter notes, upcoming appointments, etc.  Non-urgent messages can be sent to your provider as well.   To learn more about what you can do with MyChart,  go to [email or web address].    Your next appointment:   3 months  The format for your next appointment:   In Person  Provider:   You may see Kathlyn Sacramento, MD or one of the following Advanced Practice Providers on your designated Care Team:    Murray Hodgkins, NP  Christell Faith, [name]  Marrianne Mood, [name]  Cadence Jacona, [place]  Laurann Montana, NP    Other Instructions N/A

## 2020-11-10 NOTE — Progress Notes (Signed)
Cardiology Office Note   Date:  11/10/2020   ID:  Gina Ford, DOB [birth year], MRN [id number]  PCP:  Kathyrn Lass, MD  Cardiologist:   Kathlyn Sacramento, MD   Chief Complaint  Patient presents with  . Follow-up    4 weeks      History of Present Illness: Gina Ford is a 80 y.o. female who is here today for follow-up visit regarding PVCs and uncontrolled hypertension.   She has known history of essential hypertension, hyperlipidemia and hypothyroidism on replacement therapy with levothyroxine. She was evaluated by our group in 2012 for atypical chest pain.  She underwent a treadmill nuclear stress test which showed no evidence of ischemia with normal ejection fraction. She was seen last year for mild palpitations as well as exertional dyspnea. She underwent a 3-day ZIO monitor which showed normal sinus rhythm with an average heart rate of 71 bpm.  There were occasional PVCs with a burden of 2.7% with one 10 beat run of wide-complex tachycardia.  Due to that, I proceeded with a Lexiscan Myoview which showed no evidence of ischemia with normal ejection fraction.  Echocardiogram showed an EF of 60 to 65% with grade 1 diastolic dysfunction, mild mitral regurgitation and mild to moderate aortic regurgitation.  She was seen recently for elevated blood pressure consistently above 269 mmHg systolic.  She was started on losartan 50 mg daily which was subsequently increased to 100 mg daily.  She had routine labs done which showed normal renal function and electrolytes.  TSH and CBC were within normal limits.  Blood pressure readings at home improved gradually.  I reviewed her home readings over the last week and they were consistently below 485 mmHg systolic.  However, today her blood pressure is 178/68.  Repeat manually was 160/70.  She denies chest pain or shortness of breath.   Past Medical History:  Diagnosis Date  . Abnormal mammogram    NEED R DIAGNO MAMMO 3/12  . Achilles  tendonitis    B 2ND AND 3RD MTP SYNOVITIS DR. Beola Cord  . Actinic keratosis    HAND 6/11 KELLIE SHEFFIELD, [place]  . Allergic rhinitis   . Anemia   . Arthritis   . GERD (gastroesophageal reflux disease)   . Glaucoma   . HLD (hyperlipidemia)   . HTN (hypertension)   . Hyperthyroidism   . Low serum vitamin D 01/2015  . Obesity     Past Surgical History:  Procedure Laterality Date  . COLONOSCOPY    . ESOPHAGOGASTRODUODENOSCOPY  2012, 2016  . INGUINAL HERNIA REPAIR Left   . KNEE ARTHROSCOPY    . LOBECTOMY     Right Thyroid, then Left  . THYROIDECTOMY  1991  . TUBAL LIGATION    . VARICOSE VEIN SURGERY       Current Outpatient Medications  Medication Sig Dispense Refill  . amLODipine (NORVASC) 5 MG tablet Take 5 mg by mouth daily.    . Clobetasol Propionate 0.05 % shampoo Shampoo 3 days a week 118 mL 2  . hydrocortisone (ANUSOL-HC) 2.5 % rectal cream Place 1 application rectally 2 (two) times daily as needed for hemorrhoids or itching.    . latanoprost (XALATAN) 0.005 % ophthalmic solution Place 1 drop into both eyes at bedtime.    Marland Kitchen levothyroxine (SYNTHROID, LEVOTHROID) 100 MCG tablet Take 100 mcg by mouth daily before breakfast.    . losartan (COZAAR) 100 MG tablet Take 1 tablet (100 mg total) by mouth daily. 30 tablet 5  .  mometasone (ELOCON) 0.1 % lotion Apply to itchy areas on the scalp daily x 5 days 60 mL 3  . pravastatin (PRAVACHOL) 40 MG tablet Take 40 mg by mouth daily.    Marland Kitchen TIMOLOL HEMIHYDRATE OP Apply 1 drop to eye daily.    . Vitamin D, Ergocalciferol, (DRISDOL) 50000 units CAPS capsule Take 50,000 Units by mouth every 7 (seven) days.     No current facility-administered medications for this visit.    Allergies:   Doxazosin, Doxazosin mesylate, and Univasc [moexipril]    Social History:  The patient  reports that she has never smoked. She has never used smokeless tobacco. She reports current alcohol use. She reports that she does not use drugs.   Family History:   The patient's family history includes Arthritis in her father and mother; CVA in her father and mother; Colon polyps in her mother; Heart attack in an other family member; Heart attack (age of onset: 82) in her father; Hypertension in her mother; Leukemia (age of onset: 42) in her sister; Melanoma in her sister; Stroke in her father; Stroke (age of onset: 82) in her mother.    ROS:  Please see the history of present illness.   Otherwise, review of systems are positive for none.   All other systems are reviewed and negative.    PHYSICAL EXAM: VS:  BP (!) 178/68   Pulse 67   Ht 5\' 2"  (1.575 m)   Wt 143 lb (64.9 kg)   BMI 26.16 kg/m  , BMI Body mass index is 26.16 kg/m. GEN: Well nourished, well developed, in no acute distress  HEENT: normal  Neck: no JVD, carotid bruits, or masses Cardiac: RRR; no murmurs, rubs, or gallops,no edema  Respiratory:  clear to auscultation bilaterally, normal work of breathing GI: soft, nontender, nondistended, + BS MS: no deformity or atrophy  Skin: warm and dry, no rash Neuro:  Strength and sensation are intact Psych: euthymic mood, full affect   EKG:  EKG is not ordered today.   Recent Labs: 10/13/2020: ALT 15; Hemoglobin 13.2; Platelets 370; TSH 2.200 10/31/2020: BUN 14; Creatinine, Ser 0.71; Potassium 4.3; Sodium 140    Lipid Panel No results found for: CHOL, TRIG, HDL, CHOLHDL, VLDL, LDLCALC, LDLDIRECT    Wt Readings from Last 3 Encounters:  11/10/20 143 lb (64.9 kg)  10/13/20 147 lb (66.7 kg)  11/26/19 149 lb 4 oz (67.7 kg)        PAD Screen 09/29/2019  Previous PAD dx? No  Previous surgical procedure? No  Pain with walking? No  Feet/toe relief with dangling? No  Painful, non-healing ulcers? No  Extremities discolored? No      ASSESSMENT AND PLAN:  1.  Asymptomatic PVCs: Overall burden was relatively low and she is currently with no symptoms.  Continue observation.  2.  Uncontrolled essential hypertension: Blood pressure  improved with addition of losartan but still not controlled.  She is also on amlodipine.  There is shortage of losartan and thus I elected to switch her to valsartan and add hydrochlorothiazide.  She will be on 320 mg / 12.5 mg once daily.  Check basic metabolic profile in 1 to 2 weeks.  Given significant worsening of blood pressure control recently, I think we have to exclude renal artery stenosis.  I requested renal artery duplex.  3.  Hyperlipidemia: Currently on pravastatin 40 mg daily.  4.  Hypothyroidism on levothyroxine.  Recent TSH was normal.    Disposition:   FU in 3  months.  Signed,  Kathlyn Sacramento, MD  11/10/2020 11:06 AM    [place]

## 2020-11-16 DIAGNOSIS — H0100A Unspecified blepharitis right eye, upper and lower eyelids: Secondary | ICD-10-CM | POA: Diagnosis not present

## 2020-11-16 DIAGNOSIS — H0100B Unspecified blepharitis left eye, upper and lower eyelids: Secondary | ICD-10-CM | POA: Diagnosis not present

## 2020-11-16 DIAGNOSIS — H401131 Primary open-angle glaucoma, bilateral, mild stage: Secondary | ICD-10-CM | POA: Diagnosis not present

## 2020-11-25 DIAGNOSIS — E7800 Pure hypercholesterolemia, unspecified: Secondary | ICD-10-CM | POA: Diagnosis not present

## 2020-11-25 DIAGNOSIS — E559 Vitamin D deficiency, unspecified: Secondary | ICD-10-CM | POA: Diagnosis not present

## 2020-11-25 DIAGNOSIS — E039 Hypothyroidism, unspecified: Secondary | ICD-10-CM | POA: Diagnosis not present

## 2020-11-25 DIAGNOSIS — Z23 Encounter for immunization: Secondary | ICD-10-CM | POA: Diagnosis not present

## 2020-11-25 DIAGNOSIS — I10 Essential (primary) hypertension: Secondary | ICD-10-CM | POA: Diagnosis not present

## 2020-11-25 DIAGNOSIS — D649 Anemia, unspecified: Secondary | ICD-10-CM | POA: Diagnosis not present

## 2020-12-05 ENCOUNTER — Ambulatory Visit (INDEPENDENT_AMBULATORY_CARE_PROVIDER_SITE_OTHER): Payer: Medicare Other

## 2020-12-05 ENCOUNTER — Other Ambulatory Visit: Payer: Self-pay

## 2020-12-05 ENCOUNTER — Other Ambulatory Visit (INDEPENDENT_AMBULATORY_CARE_PROVIDER_SITE_OTHER): Payer: Medicare Other | Admitting: *Deleted

## 2020-12-05 DIAGNOSIS — I10 Essential (primary) hypertension: Secondary | ICD-10-CM

## 2020-12-06 LAB — BASIC METABOLIC PANEL
BUN/Creatinine Ratio: 13 (ref 12–28)
BUN: 9 mg/dL (ref 8–27)
CO2: 24 mmol/L (ref 20–29)
Calcium: 9.4 mg/dL (ref 8.7–10.3)
Chloride: 105 mmol/L (ref 96–106)
Creatinine, Ser: 0.69 mg/dL (ref 0.57–1.00)
Glucose: 103 mg/dL — ABNORMAL HIGH (ref 65–99)
Potassium: 5.1 mmol/L (ref 3.5–5.2)
Sodium: 146 mmol/L — ABNORMAL HIGH (ref 134–144)
eGFR: 89 mL/min/{1.73_m2} (ref >59)

## 2020-12-09 DIAGNOSIS — I10 Essential (primary) hypertension: Secondary | ICD-10-CM | POA: Diagnosis not present

## 2020-12-09 DIAGNOSIS — M8580 Other specified disorders of bone density and structure, unspecified site: Secondary | ICD-10-CM | POA: Diagnosis not present

## 2020-12-09 DIAGNOSIS — E7800 Pure hypercholesterolemia, unspecified: Secondary | ICD-10-CM | POA: Diagnosis not present

## 2020-12-09 DIAGNOSIS — K219 Gastro-esophageal reflux disease without esophagitis: Secondary | ICD-10-CM | POA: Diagnosis not present

## 2020-12-09 DIAGNOSIS — D649 Anemia, unspecified: Secondary | ICD-10-CM | POA: Diagnosis not present

## 2020-12-09 DIAGNOSIS — M1990 Unspecified osteoarthritis, unspecified site: Secondary | ICD-10-CM | POA: Diagnosis not present

## 2020-12-09 DIAGNOSIS — D509 Iron deficiency anemia, unspecified: Secondary | ICD-10-CM | POA: Diagnosis not present

## 2020-12-09 DIAGNOSIS — E039 Hypothyroidism, unspecified: Secondary | ICD-10-CM | POA: Diagnosis not present

## 2020-12-09 DIAGNOSIS — G4700 Insomnia, unspecified: Secondary | ICD-10-CM | POA: Diagnosis not present

## 2020-12-09 DIAGNOSIS — H409 Unspecified glaucoma: Secondary | ICD-10-CM | POA: Diagnosis not present

## 2020-12-28 ENCOUNTER — Ambulatory Visit: Payer: Medicare Other | Admitting: Dermatology

## 2020-12-28 ENCOUNTER — Other Ambulatory Visit: Payer: Self-pay

## 2020-12-28 DIAGNOSIS — L578 Other skin changes due to chronic exposure to nonionizing radiation: Secondary | ICD-10-CM

## 2020-12-28 DIAGNOSIS — D229 Melanocytic nevi, unspecified: Secondary | ICD-10-CM

## 2020-12-28 DIAGNOSIS — L821 Other seborrheic keratosis: Secondary | ICD-10-CM | POA: Diagnosis not present

## 2020-12-28 DIAGNOSIS — L814 Other melanin hyperpigmentation: Secondary | ICD-10-CM | POA: Diagnosis not present

## 2020-12-28 DIAGNOSIS — Z1283 Encounter for screening for malignant neoplasm of skin: Secondary | ICD-10-CM | POA: Diagnosis not present

## 2020-12-28 DIAGNOSIS — D1800 Hemangioma unspecified site: Secondary | ICD-10-CM | POA: Diagnosis not present

## 2020-12-28 DIAGNOSIS — L409 Psoriasis, unspecified: Secondary | ICD-10-CM

## 2020-12-28 MED ORDER — FLUOCINOLONE ACETONIDE BODY 0.01 % EX OIL: TOPICAL_OIL | CUTANEOUS | 2 refills | Status: DC

## 2020-12-28 MED ORDER — KETOCONAZOLE 2 % EX SHAM: MEDICATED_SHAMPOO | CUTANEOUS | 3 refills | Status: DC

## 2020-12-28 MED ORDER — PRAMOSONE 1-2.5 % EX CREA: TOPICAL_CREAM | CUTANEOUS | 1 refills | Status: DC

## 2020-12-28 MED ORDER — MOMETASONE FUROATE 0.1 % EX SOLN: CUTANEOUS | 3 refills | Status: DC

## 2020-12-28 NOTE — Progress Notes (Signed)
   Follow-Up Visit   Subjective  Gina Ford is a 80 y.o. female who presents for the following: TBSE. 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 She is using ketoconazole 2% shampoo, mometasone solution, and T-Sal shampoo. Scalp is improved, but she thinks the rash is spreading on body/groin area.   The following portions of the chart were reviewed this encounter and updated as appropriate:       Review of Systems:  No other skin or systemic complaints except as noted in HPI or Assessment and Plan.  Objective  Well appearing patient in no apparent distress; mood and affect are within normal limits.  A full examination was performed including scalp, head, eyes, ears, nose, lips, neck, chest, axillae, abdomen, back, buttocks, bilateral upper extremities, bilateral lower extremities, hands, feet, fingers, toes, fingernails, and toenails. All findings within normal limits unless otherwise noted below.  Objective  Scalp, groin: Mild erythema of the perianal area; mild scale of the scalp.  Pt states she is doing better today because she has been aggressively treating her scalp   Assessment & Plan   Skin cancer screening performed today.  Actinic Damage - chronic, secondary to cumulative UV radiation exposure/sun exposure over time - diffuse scaly erythematous macules with underlying dyspigmentation - Recommend daily broad spectrum sunscreen SPF 30+ to sun-exposed areas, reapply every 2 hours as needed.  - Recommend staying in the shade or wearing long sleeves, sun glasses (UVA+UVB protection) and wide brim hats (4-inch brim around the entire circumference of the hat). - Call for new or changing lesions.  Hemangiomas - Red papules - Discussed benign nature - Observe - Call for any changes  Seborrheic Keratoses - Stuck-on, waxy, tan-brown papules and/or plaques  - Benign-appearing - Discussed benign etiology and prognosis. - Observe - Call for any  changes  Lentigines - Scattered tan macules - Due to sun exposure - Benign-appering, observe - Recommend daily broad spectrum sunscreen SPF 30+ to sun-exposed areas, reapply every 2 hours as needed. - Call for any changes  Melanocytic Nevi - Tan-brown and/or pink-flesh-colored symmetric macules and papules - Benign appearing on exam today - Observation - Call clinic for new or changing moles - Recommend daily use of broad spectrum spf 30+ sunscreen to sun-exposed areas.    Psoriasis Scalp, groin  Psoriasis is a chronic non-curable, but treatable genetic/hereditary disease that may have other systemic features affecting other organ systems such as joints (Psoriatic Arthritis). It is associated with an increased risk of inflammatory bowel disease, heart disease, non-alcoholic fatty liver disease, and depression.     Start Pramosone 1-2.5% Apply to AAs face and groin qd/bid prn dsp 57g 1Rf.  Continue Ketoconazole 2% shampoo Massage into scalp and let sit several minutes before rinsing.  Continue T-Sal shampoo, alternating with ketoconazole.  Continue mometasone solution Apply to AAs scalp qd/bid prn   Start Derma-Smoothe F/S oil Apply to scalp, cover with shower cap, wash off in AM dsp 164mL 2Rf.  pramoxine-hydrocortisone (PRAMOSONE) cream - Scalp, groin  mometasone (ELOCON) 0.1 % lotion - Scalp, groin  Fluocinolone Acetonide Body 0.01 % OIL - Scalp, groin  ketoconazole (NIZORAL) 2 % shampoo - Scalp, groin  Return if symptoms worsen or fail to improve.   IJamesetta Orleans, CMA, am acting as scribe for Brendolyn Patty, MD .  Documentation: I have reviewed the above documentation for accuracy and completeness, and I agree with the above.  Brendolyn Patty MD

## 2020-12-28 NOTE — Patient Instructions (Signed)

## 2021-03-02 ENCOUNTER — Other Ambulatory Visit: Payer: Self-pay

## 2021-03-02 ENCOUNTER — Encounter: Payer: Self-pay | Admitting: Cardiovascular Disease

## 2021-03-02 ENCOUNTER — Ambulatory Visit: Payer: Medicare Other | Admitting: Cardiovascular Disease

## 2021-03-02 VITALS — BP 120/70 | HR 60 | Ht 62.0 in | Wt 142.0 lb

## 2021-03-02 DIAGNOSIS — I493 Ventricular premature depolarization: Secondary | ICD-10-CM | POA: Diagnosis not present

## 2021-03-02 DIAGNOSIS — I10 Essential (primary) hypertension: Secondary | ICD-10-CM | POA: Diagnosis not present

## 2021-03-02 DIAGNOSIS — E785 Hyperlipidemia, unspecified: Secondary | ICD-10-CM | POA: Diagnosis not present

## 2021-03-02 DIAGNOSIS — E039 Hypothyroidism, unspecified: Secondary | ICD-10-CM

## 2021-03-02 NOTE — Progress Notes (Signed)
Cardiology Office Note   Date:  03/02/2021   ID:  Gina Ford, DOB 1941-01-04, MRN [id number]  PCP:  Gladstone Lighter, MD  Cardiologist:   Kathlyn Sacramento, MD   Chief Complaint  Patient presents with   Other    3 month f/u no complaints today. Meds reviewed verbally with pt.      History of Present Illness: Gina Ford is a 80 y.o. female who is here today for follow-up visit regarding PVCs and uncontrolled hypertension.   She has known history of essential hypertension, hyperlipidemia and hypothyroidism on replacement therapy with levothyroxine. She was evaluated by our group in 2012 for atypical chest pain.  She underwent a treadmill nuclear stress test which showed no evidence of ischemia with normal ejection fraction. She is known to have mild palpitations.  ZIO monitor in 2021 showed normal sinus rhythm with an average heart rate of 71 bpm.  There were occasional PVCs with a burden of 2.7% with one 10 beat run of wide-complex tachycardia.  Due to that, I proceeded with a Lexiscan Myoview which showed no evidence of ischemia with normal ejection fraction.  Echocardiogram showed an EF of 60 to 65% with grade 1 diastolic dysfunction, mild mitral regurgitation and mild to moderate aortic regurgitation.  She had uncontrolled hypertension in spite of amlodipine and maximum dose losartan.  Renal artery duplex was done and showed no renal artery stenosis.  During last visit, I switched losartan to valsartan-hydrochlorothiazide.  She has been doing very well since then and her blood pressure has been well controlled. She denies any chest pain or shortness of breath.  No palpitations at the present time.  Past Medical History:  Diagnosis Date   Abnormal mammogram    NEED R DIAGNO MAMMO 3/12   Achilles tendonitis    B 2ND AND 3RD MTP SYNOVITIS DR. [id number]   Actinic keratosis    HAND 6/11 KELLIE [place], [place]   Allergic rhinitis    Anemia    Arthritis    GERD (gastroesophageal  reflux disease)    Glaucoma    HLD (hyperlipidemia)    HTN (hypertension)    Hyperthyroidism    Low serum vitamin D 01/2015   Obesity     Past Surgical History:  Procedure Laterality Date   COLONOSCOPY     ESOPHAGOGASTRODUODENOSCOPY  2012, 2016   INGUINAL HERNIA REPAIR Left    KNEE ARTHROSCOPY     LOBECTOMY     Right Thyroid, then Left   THYROIDECTOMY  1991   TUBAL LIGATION     VARICOSE VEIN SURGERY       Current Outpatient Medications  Medication Sig Dispense Refill   amLODipine (NORVASC) 5 MG tablet Take 5 mg by mouth daily.     hydrocortisone (ANUSOL-HC) 2.5 % rectal cream Place 1 application rectally 2 (two) times daily as needed for hemorrhoids or itching.     ketoconazole (NIZORAL) 2 % shampoo Massage into scalp, let sit several minutes before rinsing. 120 mL 3   latanoprost (XALATAN) 0.005 % ophthalmic solution Place 1 drop into both eyes at bedtime.     levothyroxine (SYNTHROID, LEVOTHROID) 100 MCG tablet Take 100 mcg by mouth daily before breakfast.     mometasone (ELOCON) 0.1 % lotion Apply to itchy areas on the scalp daily x 5 days 60 mL 3   pramoxine-hydrocortisone (PRAMOSONE) cream Apply to affected areas face and groin 1-2 times a day until improved 57 g 1   pravastatin (PRAVACHOL) 40 MG  tablet Take 40 mg by mouth daily.     TIMOLOL HEMIHYDRATE OP Apply 1 drop to eye daily.     valsartan-hydrochlorothiazide (DIOVAN-HCT) 320-12.5 MG tablet Take 1 tablet by mouth daily. 30 tablet 5   Vitamin D, Ergocalciferol, (DRISDOL) 50000 units CAPS capsule Take 50,000 Units by mouth every 7 (seven) days.     No current facility-administered medications for this visit.    Allergies:   Doxazosin, Doxazosin mesylate, and Univasc [moexipril]    Social History:  The patient  reports that she has never smoked. She has never used smokeless tobacco. She reports current alcohol use. She reports that she does not use drugs.   Family History:  The patient's family history includes  Arthritis in her father and mother; CVA in her father and mother; Colon polyps in her mother; Heart attack in an other family member; Heart attack (age of onset: 41) in her father; Hypertension in her mother; Leukemia (age of onset: 2) in her sister; Melanoma in her sister; Stroke in her father; Stroke (age of onset: 50) in her mother.    ROS:  Please see the history of present illness.   Otherwise, review of systems are positive for none.   All other systems are reviewed and negative.    PHYSICAL EXAM: VS:  BP 120/70 (BP Location: Left Arm, Patient Position: Sitting, Cuff Size: Normal)   Pulse 60   Ht 5\' 2"  (1.575 m)   Wt 142 lb (64.4 kg)   SpO2 98%   BMI 25.97 kg/m  , BMI Body mass index is 25.97 kg/m. GEN: Well nourished, well developed, in no acute distress  HEENT: normal  Neck: no JVD, carotid bruits, or masses Cardiac: RRR; no murmurs, rubs, or gallops,no edema  Respiratory:  clear to auscultation bilaterally, normal work of breathing GI: soft, nontender, nondistended, + BS MS: no deformity or atrophy  Skin: warm and dry, no rash Neuro:  Strength and sensation are intact Psych: euthymic mood, full affect   EKG:  EKG is not ordered today.   Recent Labs: 10/13/2020: ALT 15; Hemoglobin 13.2; Platelets 370; TSH 2.200 12/05/2020: BUN 9; Creatinine, Ser 0.69; Potassium 5.1; Sodium 146    Lipid Panel No results found for: CHOL, TRIG, HDL, CHOLHDL, VLDL, LDLCALC, LDLDIRECT    Wt Readings from Last 3 Encounters:  03/02/21 142 lb (64.4 kg)  11/10/20 143 lb (64.9 kg)  10/13/20 147 lb (66.7 kg)        PAD Screen 09/29/2019  Previous PAD dx? No  Previous surgical procedure? No  Pain with walking? No  Feet/toe relief with dangling? No  Painful, non-healing ulcers? No  Extremities discolored? No      ASSESSMENT AND PLAN:  1.  Asymptomatic PVCs: Overall burden was relatively low and she is currently with no symptoms.  Continue observation.  No need for medications  especially with baseline bradycardia.  2.  Essential hypertension: Blood pressure improved significantly with addition of hydrochlorothiazide and switching to valsartan.  Renal artery duplex was reviewed with her and showed no evidence of renal artery stenosis.  3.  Hyperlipidemia: Currently on pravastatin 40 mg daily.  4.  Hypothyroidism on levothyroxine.  Recent TSH was normal.    Disposition:   FU in 12 months.  Signed,  Kathlyn Sacramento, MD  03/02/2021 10:15 AM    [place]

## 2021-03-02 NOTE — Patient Instructions (Signed)

## 2021-04-10 ENCOUNTER — Other Ambulatory Visit: Payer: Self-pay | Admitting: Cardiovascular Disease

## 2021-04-10 DIAGNOSIS — I10 Essential (primary) hypertension: Secondary | ICD-10-CM

## 2021-07-28 ENCOUNTER — Other Ambulatory Visit: Payer: Self-pay | Admitting: Dermatology

## 2021-07-28 DIAGNOSIS — L409 Psoriasis, unspecified: Secondary | ICD-10-CM

## 2021-09-20 ENCOUNTER — Ambulatory Visit (INDEPENDENT_AMBULATORY_CARE_PROVIDER_SITE_OTHER): Payer: Medicare Other | Admitting: Dermatology

## 2021-09-20 ENCOUNTER — Other Ambulatory Visit: Payer: Self-pay

## 2021-09-20 ENCOUNTER — Encounter: Payer: Self-pay | Admitting: Dermatology

## 2021-09-20 DIAGNOSIS — G5713 Meralgia paresthetica, bilateral lower limbs: Secondary | ICD-10-CM

## 2021-09-20 DIAGNOSIS — L281 Prurigo nodularis: Secondary | ICD-10-CM

## 2021-09-20 DIAGNOSIS — L219 Seborrheic dermatitis, unspecified: Secondary | ICD-10-CM

## 2021-09-20 DIAGNOSIS — L853 Xerosis cutis: Secondary | ICD-10-CM

## 2021-09-20 DIAGNOSIS — G5710 Meralgia paresthetica, unspecified lower limb: Secondary | ICD-10-CM

## 2021-09-20 MED ORDER — PIMECROLIMUS 1 % EX CREA: TOPICAL_CREAM | Freq: Two times a day (BID) | CUTANEOUS | 2 refills | Status: DC

## 2021-09-20 MED ORDER — CLOBETASOL PROPIONATE 0.05 % EX SOLN: CUTANEOUS | 1 refills | Status: DC

## 2021-09-20 NOTE — Progress Notes (Signed)
Clobetasol solution was not sent during OV. Sent now. JP

## 2021-09-20 NOTE — Progress Notes (Signed)
Follow-Up Visit   Subjective  Gina Ford is a 81 y.o. 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 Using Ketoconazole 2% shampoo every other day, was using daily. Not using topical Rx corticosteroids, she states she doesn't feel like they helped at all. ).  She uses pramasone cream in groin prn itch, but it was very expensive.    The following portions of the chart were reviewed this encounter and updated as appropriate:      Review of Systems: No other skin or systemic complaints except as noted in HPI or Assessment and Plan.   Objective  Well appearing patient in no apparent distress; mood and affect are within normal limits.  A focused examination was performed including head, including the scalp, face, neck, nose, ears, eyelids, and lips and limbs. Relevant physical exam findings are noted in the Assessment and Plan.  Mid Occipital Scalp Firm pink tan papule at occipital scalp  Scalp Mild erythema with scale in ears, focal mild erythema and scale at scalp, mild pink scaliness eyebrows   B/L thighs Normal appearing skin on clinical exam, pt states itches and is worse at night   Assessment & Plan  Prurigo nodularis Mid Occipital Scalp  Benign-appearing, observation  Avoid scratching/picking at area.   Start Clobetasol solution twice daily to affected area on scalp up to 2-4 weeks and then prn itch.  Avoid applying to face, groin, and axilla. Use as directed. Long-term use can cause thinning of the skin.  Discussed LN2. Patient deferred treatment at this time.   Seborrheic dermatitis Scalp  Start Pimecrolimus cream twice daily to affected areas on face, ears and groin.   Continue Ketoconazole 2% shampoo 3 times per as directed.  Seborrheic Dermatitis  -  is a chronic persistent rash characterized by pinkness and scaling most commonly of the mid face but also can occur on the scalp (dandruff), ears; mid  chest, mid back and groin.  It tends to be exacerbated by stress and cooler weather.  People who have neurologic disease may experience new onset or exacerbation of existing seborrheic dermatitis.  The condition is not curable but treatable and can be controlled.   pimecrolimus (ELIDEL) 1 % cream - Scalp Apply topically 2 (two) times daily. To affected areas on face, groin and ears  Meralgia paresthetica, unspecified laterality B/L thighs  Meralgia paresthetica is a chronic condition affecting the skin of the thighs in which a pinched nerve along the spine causes itching or changes in sensation in an area of skin. This is usually accompanied by chronic rubbing or scratching. There is no cure, but there are some treatments which may help control the itch.   Over the counter (non-prescription) treatments for notalgia paresthetica include numbing creams like pramoxine or lidocaine which temporarily reduce itch or Capsaicin-containing creams which cause a burning sensation but which sometimes over time will reset the nerves to stop producing itch.   Recommend OTC Gold Bond Rapid Relief Anti-Itch cream (pramoxine + menthol), CeraVe Anti-itch cream or lotion (pramoxine), Sarna lotion (Original- menthol + camphor or Sensitive- pramoxine) or Eucerin 12 hour Itch Relief lotion (menthol) up to 3 times per day to areas on body that are itchy.    Xerosis - diffuse xerotic patches - recommend gentle, hydrating skin care - gentle skin care handout given   Return if symptoms worsen or fail to improve.  I, Emelia Salisbury, CMA, am acting as scribe for Brendolyn Patty, MD.  Documentation: I have reviewed the above documentation for accuracy and completeness, and I agree with the above.  Brendolyn Patty MD

## 2021-09-20 NOTE — Patient Instructions (Addendum)
Recommend OTC Gold Bond Rapid Relief Anti-Itch cream (pramoxine + menthol), CeraVe Anti-itch cream or lotion (pramoxine), Sarna lotion (Original- menthol + camphor or Sensitive- pramoxine) or Eucerin 12 hour Itch Relief lotion (menthol) up to 3 times per day to areas on body that are itchy.  Start Clobetasol solution twice daily to affected area on scalp up to 2 weeks as needed only. Avoid applying to face, groin, and axilla. Use as directed. Long-term use can cause thinning of the skin.  Topical steroids (such as triamcinolone, fluocinolone, fluocinonide, mometasone, clobetasol, halobetasol, betamethasone, hydrocortisone) can cause thinning and lightening of the skin if they are used for too long in the same area. Your physician has selected the right strength medicine for your problem and area affected on the body. Please use your medication only as directed by your physician to prevent side effects.    Start Pimecrolimus cream twice daily to affected areas on face, ears and groin.   Continue Ketoconazole 2% shampoo 3 times per as directed.   Gentle Skin Care Guide  1. Bathe no more than once a day.  2. Avoid bathing in hot water  3. Use a mild soap like Dove, Vanicream, Cetaphil, CeraVe. Can use Lever 2000 or Cetaphil antibacterial soap  4. Use soap only where you need it. On most days, use it under your arms, between your legs, and on your feet. Let the water rinse other areas unless visibly dirty.  5. When you get out of the bath/shower, use a towel to gently blot your skin dry, don't rub it.  6. While your skin is still a little damp, apply a moisturizing cream such as Vanicream, CeraVe, Cetaphil, Eucerin, Sarna lotion or plain Vaseline Jelly. For hands apply Neutrogena [place] Hand Cream or Excipial Hand Cream.  7. Reapply moisturizer any time you start to itch or feel dry.  8. Sometimes using free and clear laundry detergents can be helpful. Fabric softener sheets should be  avoided. Downy Free & Gentle liquid, or any liquid fabric softener that is free of dyes and perfumes, it acceptable to use  9. If your doctor has given you prescription creams you may apply moisturizers over them   If You Need Anything After Your Visit  If you have any questions or concerns for your doctor, please call our main line at [phone] and press option 4 to reach your doctor's medical assistant. If no one answers, please leave a voicemail as directed and we will return your call as soon as possible. Messages left after 4 pm will be answered the following business day.   You may also send [place] a message via [place]. We typically respond to MyChart messages within 1-2 business days.  For prescription refills, please ask your pharmacy to contact our office. Our fax number is [phone].  If you have an urgent issue when the clinic is closed that cannot wait until the next business day, you can page your doctor at the number below.    Please note that while we do our best to be available for urgent issues outside of office hours, we are not available 24/7.   If you have an urgent issue and are unable to reach [place], you may choose to seek medical care at your doctor's office, retail clinic, urgent care center, or emergency room.  If you have a medical emergency, please immediately call 911 or go to the emergency department.  Pager Numbers  - Dr. Nehemiah Massed: [phone]  - Dr. Laurence Ferrari: [phone]  -  Dr. Nicole Kindred: [phone]  In the event of inclement weather, please call our main line at [phone] for an update on the status of any delays or closures.  Dermatology Medication Tips: Please keep the boxes that topical medications come in in order to help keep track of the instructions about where and how to use these. Pharmacies typically print the medication instructions only on the boxes and not directly on the medication tubes.   If your medication is too expensive, please  contact our office at [phone] option 4 or send [place] a message through [place].   We are unable to tell what your co-pay for medications will be in advance as this is different depending on your insurance coverage. However, we may be able to find a substitute medication at lower cost or fill out paperwork to get insurance to cover a needed medication.   If a prior authorization is required to get your medication covered by your insurance company, please allow [place] 1-2 business days to complete this process.  Drug prices often vary depending on where the prescription is filled and some pharmacies may offer cheaper prices.  The website www.goodrx.com contains coupons for medications through different pharmacies. The prices here do not account for what the cost may be with help from insurance (it may be cheaper with your insurance), but the website can give you the price if you did not use any insurance.  - You can print the associated coupon and take it with your prescription to the pharmacy.  - You may also stop by our office during regular business hours and pick up a GoodRx coupon card.  - If you need your prescription sent electronically to a different pharmacy, notify our office through [facility] or by phone at [phone] option 4.     Si Usted Necesita Algo Despus de Su Visita  Tambin puede enviarnos un mensaje a travs de Pharmacist, community. Por lo general respondemos a los mensajes de MyChart en el transcurso de 1 a 2 das hbiles.  Para renovar recetas, por favor pida a su farmacia que se ponga en contacto con nuestra oficina. Harland Dingwall de fax es [place] [phone].  Si tiene un asunto urgente cuando la clnica est cerrada y que no puede esperar hasta el siguiente da hbil, puede llamar/localizar a su doctor(a) al nmero que aparece a continuacin.   Por favor, tenga en cuenta que aunque hacemos todo lo posible para estar disponibles para asuntos urgentes fuera del horario de  [place], no estamos disponibles las 24 horas del da, los 7 das de la [place].   Si tiene un problema urgente y no puede comunicarse con nosotros, puede optar por buscar atencin mdica  en el consultorio de su doctor(a), en una clnica privada, en un centro de atencin urgente o en una sala de emergencias.  Si tiene Engineering geologist, por favor llame inmediatamente al 911 o vaya a la sala de emergencias.  Nmeros de bper  - Dr. Nehemiah Massed: [phone]  - Dra. Moye: [phone]  - Dra. Nicole Kindred: [phone]  En caso de inclemencias del [place], por favor llame a Johnsie Kindred principal al [phone] para una actualizacin sobre el [place] de cualquier retraso o cierre.  Consejos para la medicacin en dermatologa: Por favor, guarde las cajas en las que vienen los medicamentos de uso tpico para ayudarle a seguir las instrucciones sobre dnde y cmo usarlos. Las farmacias generalmente imprimen las instrucciones del medicamento slo en las cajas y no directamente en los tubos  del medicamento.   Si su medicamento es muy caro, por favor, pngase en contacto con Zigmund Daniel llamando al [phone] y presione la opcin 4 o envenos un mensaje a travs de Pharmacist, community.   No podemos decirle cul ser su copago por los medicamentos por adelantado ya que esto es diferente dependiendo de la cobertura de su seguro. Sin embargo, es posible que podamos encontrar un medicamento sustituto a Electrical engineer un formulario para que el seguro cubra el medicamento que se considera necesario.   Si se requiere una autorizacin previa para que su compaa de seguros [place] su medicamento, por favor permtanos de 1 a 2 das hbiles para completar este proceso.  Los precios de los medicamentos varan con frecuencia dependiendo del Environmental consultant de dnde se surte la receta y alguna farmacias pueden ofrecer precios ms baratos.  El sitio web www.goodrx.com tiene cupones para medicamentos de Airline pilot. Los  precios aqu no tienen en cuenta lo que podra costar con la ayuda del seguro (puede ser ms barato con su seguro), pero el sitio web puede darle el precio si no utiliz Research scientist (physical sciences).  - Puede imprimir el cupn correspondiente y llevarlo con su receta a la farmacia.  - Tambin puede pasar por nuestra oficina durante el horario de atencin regular y Charity fundraiser una tarjeta de cupones de GoodRx.  - Si necesita que su receta se enve electrnicamente a una farmacia diferente, informe a nuestra oficina a travs de MyChart de [place] o por telfono llamando al [phone] y presione la opcin 4.

## 2021-10-11 ENCOUNTER — Other Ambulatory Visit: Payer: Self-pay | Admitting: Cardiovascular Disease

## 2021-10-11 DIAGNOSIS — I10 Essential (primary) hypertension: Secondary | ICD-10-CM

## 2021-11-30 ENCOUNTER — Other Ambulatory Visit: Payer: Self-pay | Admitting: Dermatology

## 2021-11-30 DIAGNOSIS — L409 Psoriasis, unspecified: Secondary | ICD-10-CM

## 2022-03-15 ENCOUNTER — Ambulatory Visit: Payer: Medicare Other | Admitting: Cardiovascular Disease

## 2022-03-15 ENCOUNTER — Encounter: Payer: Self-pay | Admitting: Cardiovascular Disease

## 2022-03-15 VITALS — BP 110/70 | HR 60 | Ht 62.0 in | Wt 147.1 lb

## 2022-03-15 DIAGNOSIS — E039 Hypothyroidism, unspecified: Secondary | ICD-10-CM

## 2022-03-15 DIAGNOSIS — I872 Venous insufficiency (chronic) (peripheral): Secondary | ICD-10-CM

## 2022-03-15 DIAGNOSIS — E785 Hyperlipidemia, unspecified: Secondary | ICD-10-CM

## 2022-03-15 DIAGNOSIS — I493 Ventricular premature depolarization: Secondary | ICD-10-CM | POA: Diagnosis not present

## 2022-03-15 DIAGNOSIS — I10 Essential (primary) hypertension: Secondary | ICD-10-CM | POA: Diagnosis not present

## 2022-03-15 NOTE — Progress Notes (Signed)
Cardiology Office Note   Date:  03/15/2022   ID:  Gina Ford, DOB 31-Jan-1941, MRN [id number]  PCP:  Gladstone Lighter, MD  Cardiologist:   Kathlyn Sacramento, MD   Chief Complaint  Patient presents with   Other    12 month f/u c/o edema ankles. Meds reviewed verbally with pt.      History of Present Illness: Gina Ford is a 81 y.o. female who is here today for follow-up visit regarding PVCs and uncontrolled hypertension.   She has known history of essential hypertension, hyperlipidemia and hypothyroidism on replacement therapy with levothyroxine. She was evaluated by our group in 2012 for atypical chest pain.  She underwent a treadmill nuclear stress test which showed no evidence of ischemia with normal ejection fraction. She is known to have mild palpitations.  ZIO monitor in 2021 showed normal sinus rhythm with an average heart rate of 71 bpm.  There were occasional PVCs with a burden of 2.7% with one 10 beat run of wide-complex tachycardia.  Due to that, I proceeded with a Lexiscan Myoview which showed no evidence of ischemia with normal ejection fraction.  Echocardiogram showed an EF of 60 to 65% with grade 1 diastolic dysfunction, mild mitral regurgitation and mild to moderate aortic regurgitation.  She had uncontrolled hypertension in spite of amlodipine and maximum dose losartan.  Renal artery duplex was done and showed no renal artery stenosis.  Blood pressure improved with addition of small dose hydrochlorothiazide.  She has been doing very well with no chest pain, shortness of breath or palpitations.  Her amlodipine was increased by Dr. Tressia Miners to 7.5 mg once daily.  She did have some worsening leg edema recently and took half a dose of her husband's furosemide.  She is planning to move to [facility] with her husband next year.  Past Medical History:  Diagnosis Date   Abnormal mammogram    NEED R DIAGNO MAMMO 3/12   Achilles tendonitis    B 2ND AND 3RD MTP  SYNOVITIS DR. PZWCHEN   Actinic keratosis    HAND 6/11 KELLIE SHEFFIELD, [place]   Allergic rhinitis    Anemia    Arthritis    GERD (gastroesophageal reflux disease)    Glaucoma    HLD (hyperlipidemia)    HTN (hypertension)    Hyperthyroidism    Low serum vitamin D 01/2015   Obesity     Past Surgical History:  Procedure Laterality Date   COLONOSCOPY     ESOPHAGOGASTRODUODENOSCOPY  2012, 2016   INGUINAL HERNIA REPAIR Left    KNEE ARTHROSCOPY     LOBECTOMY     Right Thyroid, then Left   THYROIDECTOMY  1991   TUBAL LIGATION     VARICOSE VEIN SURGERY       Current Outpatient Medications  Medication Sig Dispense Refill   amLODipine (NORVASC) 2.5 MG tablet Take 2.5 mg by mouth daily.     amLODipine (NORVASC) 5 MG tablet Take 5 mg by mouth daily.     hydrocortisone (ANUSOL-HC) 2.5 % rectal cream Place 1 application rectally 2 (two) times daily as needed for hemorrhoids or itching.     ketoconazole (NIZORAL) 2 % shampoo MASSAGE INTO SCALP, LET SIT SEVERAL MINUTES BEFORE RINSING. 120 mL 3   latanoprost (XALATAN) 0.005 % ophthalmic solution Place 1 drop into both eyes at bedtime.     levothyroxine (SYNTHROID, LEVOTHROID) 100 MCG tablet Take 100 mcg by mouth daily before breakfast.     mometasone (ELOCON)  0.1 % lotion Apply to itchy areas on the scalp daily x 5 days 60 mL 3   pravastatin (PRAVACHOL) 40 MG tablet Take 40 mg by mouth daily.     TIMOLOL HEMIHYDRATE OP Apply 1 drop to eye daily.     valsartan-hydrochlorothiazide (DIOVAN-HCT) 320-12.5 MG tablet TAKE 1 TABLET BY MOUTH EVERY DAY 90 tablet 0   Vitamin D, Ergocalciferol, (DRISDOL) 50000 units CAPS capsule Take 50,000 Units by mouth every 7 (seven) days.     No current facility-administered medications for this visit.    Allergies:   Doxazosin, Doxazosin mesylate, and Univasc [moexipril]    Social History:  The patient  reports that she has never smoked. She has never used smokeless tobacco. She reports current alcohol use.  She reports that she does not use drugs.   Family History:  The patient's family history includes Arthritis in her father and mother; CVA in her father and mother; Colon polyps in her mother; Heart attack in an other family member; Heart attack (age of onset: 55) in her father; Hypertension in her mother; Leukemia (age of onset: 31) in her sister; Melanoma in her sister; Stroke in her father; Stroke (age of onset: 98) in her mother.    ROS:  Please see the history of present illness.   Otherwise, review of systems are positive for none.   All other systems are reviewed and negative.    PHYSICAL EXAM: VS:  BP 110/70 (BP Location: Left Arm, Patient Position: Sitting, Cuff Size: Normal)   Pulse 60   Ht '5\' 2"'$  (1.575 m)   Wt 147 lb 2 oz (66.7 kg)   SpO2 96%   BMI 26.91 kg/m  , BMI Body mass index is 26.91 kg/m. GEN: Well nourished, well developed, in no acute distress  HEENT: normal  Neck: no JVD, carotid bruits, or masses Cardiac: RRR; no murmurs, rubs, or gallops, mild bilateral leg edema  Respiratory:  clear to auscultation bilaterally, normal work of breathing GI: soft, nontender, nondistended, + BS MS: no deformity or atrophy  Skin: warm and dry, no rash Neuro:  Strength and sensation are intact Psych: euthymic mood, full affect   EKG:  EKG is ordered today. EKG showed normal sinus rhythm with a PVC  Recent Labs: No results found for requested labs within last 365 days.    Lipid Panel No results found for: "CHOL", "TRIG", "HDL", "CHOLHDL", "VLDL", "LDLCALC", "LDLDIRECT"    Wt Readings from Last 3 Encounters:  03/15/22 147 lb 2 oz (66.7 kg)  03/02/21 142 lb (64.4 kg)  11/10/20 143 lb (64.9 kg)           09/29/2019    3:20 PM  PAD Screen  Previous PAD dx? No  Previous surgical procedure? No  Pain with walking? No  Feet/toe relief with dangling? No  Painful, non-healing ulcers? No  Extremities discolored? No      ASSESSMENT AND PLAN:  1.  Asymptomatic  PVCs: Overall burden was relatively low and she is currently with no symptoms.  Continue observation.  No need for medications especially with baseline bradycardia.  2.  Essential hypertension: Blood pressures well controlled on current medications.  Left leg edema becomes a persistent issue, consider decreasing amlodipine and increasing hydrochlorothiazide.  3.  Hyperlipidemia: Currently on pravastatin 40 mg daily.  I reviewed most recent lipid profile which showed an LDL of 117.  I discussed with her the importance of improving her diet.  4.  Hypothyroidism on levothyroxine.  Recent TSH  was normal.  5.  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  I discussed with her the importance of leg elevation and using knee-high support stockings if needed.    Disposition:   FU in 12 months.  Signed,  Kathlyn Sacramento, MD  03/15/2022 8:15 AM    [place]

## 2022-03-15 NOTE — Patient Instructions (Signed)

## 2022-06-07 ENCOUNTER — Other Ambulatory Visit: Payer: Self-pay | Admitting: Dermatology

## 2022-06-07 DIAGNOSIS — L409 Psoriasis, unspecified: Secondary | ICD-10-CM

## 2022-07-20 ENCOUNTER — Other Ambulatory Visit: Payer: Self-pay | Admitting: Internal Medicine

## 2022-07-20 DIAGNOSIS — R1030 Lower abdominal pain, unspecified: Secondary | ICD-10-CM

## 2022-07-20 DIAGNOSIS — R197 Diarrhea, unspecified: Secondary | ICD-10-CM

## 2022-08-13 ENCOUNTER — Ambulatory Visit
Admission: RE | Admit: 2022-08-13 | Discharge: 2022-08-13 | Disposition: A | Discharge status: Home / Self Care | Payer: Medicare Other | Source: Ambulatory Visit | Attending: Internal Medicine | Admitting: Internal Medicine

## 2022-08-13 DIAGNOSIS — R1030 Lower abdominal pain, unspecified: Secondary | ICD-10-CM | POA: Insufficient documentation

## 2022-08-13 DIAGNOSIS — R197 Diarrhea, unspecified: Secondary | ICD-10-CM | POA: Diagnosis not present

## 2022-08-13 LAB — POCT I-STAT CREATININE: Creatinine, Ser: 0.7 mg/dL (ref 0.44–1.00)

## 2022-08-13 MED ORDER — IOHEXOL 300 MG/ML  SOLN
100.0000 mL | Freq: Once | INTRAMUSCULAR | Status: AC | PRN
  Administered 2022-08-13: 100 mL via INTRAVENOUS

## 2023-01-30 ENCOUNTER — Ambulatory Visit: Payer: Medicare HMO | Admitting: Dermatology

## 2023-01-30 VITALS — BP 131/77 | HR 70

## 2023-01-30 DIAGNOSIS — G5711 Meralgia paresthetica, right lower limb: Secondary | ICD-10-CM | POA: Diagnosis not present

## 2023-01-30 DIAGNOSIS — L281 Prurigo nodularis: Secondary | ICD-10-CM

## 2023-01-30 MED ORDER — BETAMETHASONE DIPROPIONATE 0.05 % EX LOTN: TOPICAL_LOTION | CUTANEOUS | 2 refills | Status: DC

## 2023-01-30 MED ORDER — HYDROCORTISONE 2.5 % EX CREA: TOPICAL_CREAM | CUTANEOUS | 3 refills | Status: DC

## 2023-01-30 MED ORDER — CLOBETASOL PROPIONATE 0.05 % EX SHAM: MEDICATED_SHAMPOO | CUTANEOUS | 2 refills | Status: DC

## 2023-01-30 NOTE — Patient Instructions (Addendum)
Start clobetasol shampoo apply to dry scalp every other day and let sit for 15-20 minutes before shampooing out.  Start betamethasone dipropionate lotion Spot treat more severe areas of scalp up to twice daily until improved.  Start hydrocortisone 2.5% cream- apply twice daily to affected areas groin as needed.  Avoid scratching/picking.   Due to recent changes in healthcare laws, you may see results of your pathology and/or laboratory studies on MyChart before the doctors have had a chance to review them. We understand that in some cases there may be results that are confusing or concerning to you. Please understand that not all results are received at the same time and often the doctors may need to interpret multiple results in order to provide you with the best plan of care or course of treatment. Therefore, we ask that you please give [place] 2 business days to thoroughly review all your results before contacting the office for clarification. Should we see a critical lab result, you will be contacted sooner.   If You Need Anything After Your Visit  If you have any questions or concerns for your doctor, please call our main line at [phone] and press option 4 to reach your doctor's medical assistant. If no one answers, please leave a voicemail as directed and we will return your call as soon as possible. Messages left after 4 pm will be answered the following business day.   You may also send [place] a message via MyChart. We typically respond to MyChart messages within 1-2 business days.  For prescription refills, please ask your pharmacy to contact our office. Our fax number is [phone].  If you have an urgent issue when the clinic is closed that cannot wait until the next business day, you can page your doctor at the number below.    Please note that while we do our best to be available for urgent issues outside of office hours, we are not available 24/7.   If you have an urgent issue and  are unable to reach [place], you may choose to seek medical care at your doctor's office, retail clinic, urgent care center, or emergency room.  If you have a medical emergency, please immediately call 911 or go to the emergency department.  Pager Numbers  - Dr. Gwen Pounds: [phone]  - Dr. Neale Burly: [phone]  - Dr. Roseanne Reno: [phone]  In the event of inclement weather, please call our main line at [phone] for an update on the status of any delays or closures.  Dermatology Medication Tips: Please keep the boxes that topical medications come in in order to help keep track of the instructions about where and how to use these. Pharmacies typically print the medication instructions only on the boxes and not directly on the medication tubes.   If your medication is too expensive, please contact our office at [phone] option 4 or send [place] a message through MyChart.   We are unable to tell what your co-pay for medications will be in advance as this is different depending on your insurance coverage. However, we may be able to find a substitute medication at lower cost or fill out paperwork to get insurance to cover a needed medication.   If a prior authorization is required to get your medication covered by your insurance company, please allow [place] 1-2 business days to complete this process.  Drug prices often vary depending on where the prescription is filled and some pharmacies may offer cheaper prices.  The website www.goodrx.com contains  coupons for medications through different pharmacies. The prices here do not account for what the cost may be with help from insurance (it may be cheaper with your insurance), but the website can give you the price if you did not use any insurance.  - You can print the associated coupon and take it with your prescription to the pharmacy.  - You may also stop by our office during regular business hours and pick up a GoodRx coupon card.  - If you need your  prescription sent electronically to a different pharmacy, notify our office through [facility] or by phone at [phone] option 4.     Si Usted Necesita Algo Despus de Su Visita  Tambin puede enviarnos un mensaje a travs de Clinical cytogeneticist. Por lo general respondemos a los mensajes de MyChart en el transcurso de 1 a 2 das hbiles.  Para renovar recetas, por favor pida a su farmacia que se ponga en contacto con nuestra oficina. Annie Sable de fax es [place] [phone].  Si tiene un asunto urgente cuando la clnica est cerrada y que no puede esperar hasta el siguiente da hbil, puede llamar/localizar a su doctor(a) al nmero que aparece a continuacin.   Por favor, tenga en cuenta que aunque hacemos todo lo posible para estar disponibles para asuntos urgentes fuera del horario de [place], no estamos disponibles las 24 horas del da, los 7 [address]  [address] de la [place].   Si tiene un problema urgente y no puede comunicarse con nosotros, puede optar por buscar atencin mdica  en el consultorio de su doctor(a), en una clnica privada, en un centro de atencin urgente o en una sala de emergencias.  Si tiene Engineer, drilling, por favor llame inmediatamente al 911 o vaya a la sala de emergencias.  Nmeros de bper  - Dr. Gwen Pounds: [phone]  - Dra. Moye: [phone]  - Dra. Roseanne Reno: [phone]  En caso de inclemencias del [place], por favor llame a Lacy Duverney principal al [phone] para una actualizacin sobre el [place] de cualquier retraso o cierre.  Consejos para la medicacin en dermatologa: Por favor, guarde las cajas en las que vienen los medicamentos de uso tpico para ayudarle a seguir las instrucciones sobre dnde y cmo usarlos. Las farmacias generalmente imprimen las instrucciones del medicamento slo en las cajas y no directamente en los tubos del [place].   Si su medicamento es muy caro, por favor, pngase en contacto con Rolm Gala llamando al [phone]  y presione la opcin 4 o envenos un mensaje a travs de Clinical cytogeneticist.   No podemos decirle cul ser su copago por los medicamentos por adelantado ya que esto es diferente dependiendo de la cobertura de su seguro. Sin embargo, es posible que podamos encontrar un medicamento sustituto a Audiological scientist un formulario para que el seguro cubra el medicamento que se considera necesario.   Si se requiere una autorizacin previa para que su compaa de seguros [place] su medicamento, por favor permtanos de 1 a 2 das hbiles para completar [address]  Los precios de los medicamentos varan con frecuencia dependiendo del Environmental consultant de dnde se surte la receta y alguna farmacias pueden ofrecer precios ms baratos.  El sitio web www.goodrx.com tiene cupones para medicamentos de Health and safety inspector. Los precios aqu no tienen en cuenta lo que podra costar con la ayuda del seguro (puede ser ms barato con su seguro), pero el sitio web puede darle el precio si no utiliz Tourist information centre manager.  - Puede imprimir el cupn  correspondiente y llevarlo con su receta a la farmacia.  - Tambin puede pasar por nuestra oficina durante el horario de atencin regular y Education officer, museum una tarjeta de cupones de GoodRx.  - Si necesita que su receta se enve electrnicamente a una farmacia diferente, informe a nuestra oficina a travs de MyChart de [place] o por telfono llamando al [phone] y presione la opcin 4.

## 2023-01-30 NOTE — Progress Notes (Signed)
   Follow-Up Visit   Subjective  Gina Ford is a 82 y.o. female who presents for the following: Itching of the scalp and groin off and on for years. She is using ketoconazole 2% shampoo every other day to scalp, but itching has worsened. Not currently treating groin.    The following portions of the chart were reviewed this encounter and updated as appropriate: medications, allergies, medical history  Review of Systems:  No other skin or systemic complaints except as noted in HPI or Assessment and Plan.  Objective  Well appearing patient in no apparent distress; mood and affect are within normal limits.  A focused examination was performed of the following areas: Face, scalp, groin  Relevant exam findings are noted in the Assessment and Plan.  thighs, buttocks Clear today. 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  Chronic and persistent condition with duration or expected duration over one year. Condition is bothersome/symptomatic for patient. Currently flared.  Treatment Plan: Start clobetasol shampoo apply to dry scalp every other day and let sit for 15-20 minutes before shampooing out.  Start betamethasone dipropionate lotion Spot treat more severe areas of scalp up to twice daily until improved.  Start hydrocortisone 2.5% cream- apply BID to AA groin as needed for itch. Caution skin atrophy with long-term use.   Avoid picking, scratching, rubbing areas  May consider IL steroid injections, and/or Dupixent if not improving with topicals   Meralgia paresthetica of right side thighs, buttocks  BL thighs, Chronic and persistent condition with duration or expected duration over one year. Condition is  symptomatic/ bothersome to patient. Not currently at goal.   Meralgia paresthetica is a chronic condition affecting the skin of the thighs in which a pinched nerve along the spine causes itching or changes in sensation in an area of skin. This is usually accompanied by chronic rubbing or scratching often leaving the area of skin discolored and thickened. There is no cure, but there are some treatments which may help control the itch.   Over the counter (non-prescription) treatments for meralgia paresthetica include numbing creams like pramoxine or lidocaine which temporarily reduce itch or Capsaicin-containing creams which cause a burning sensation but which sometimes over time will reset the nerves to stop producing itch.  If you choose to use Capsaicin cream, it is recommended to use it 5 times daily for 1 week followed by 3 times daily for 3-6 weeks. You may have to continue using it long-term. - If not doing well with OTC options, could consider [employer] compounded prescription anti-itch cream with Amitriptyline 5% / Lidocaine 5% / Pramoxine 1% or Amitriptyline 5% / Gabapentin 10% / Lidocaine 5% Cream. For severe cases, there are some prescription cream or pill options which may help. Other treatment options include: - Transcutaneous Electrical Nerve Stimulation (TENS) - Gabapentin 300-900 mg daily po - Amitriptyline orally - Paravertebral local anesthetic block - intralesional Botulinum toxin A      Return 4-6 weeks, for PN vs Psoriasis.  ICherlyn Labella, CMA, am acting as scribe for Willeen Niece, MD .   Documentation: I have reviewed the above documentation for accuracy and completeness, and I agree with the above.  Willeen Niece, MD

## 2023-03-05 ENCOUNTER — Ambulatory Visit: Payer: Medicare HMO | Admitting: Dermatology

## 2023-03-05 VITALS — BP 139/75

## 2023-03-05 DIAGNOSIS — Z79899 Other long term (current) drug therapy: Secondary | ICD-10-CM | POA: Diagnosis not present

## 2023-03-05 DIAGNOSIS — L219 Seborrheic dermatitis, unspecified: Secondary | ICD-10-CM | POA: Diagnosis not present

## 2023-03-05 DIAGNOSIS — L280 Lichen simplex chronicus: Secondary | ICD-10-CM

## 2023-03-05 DIAGNOSIS — L281 Prurigo nodularis: Secondary | ICD-10-CM | POA: Diagnosis not present

## 2023-03-05 MED ORDER — DUPIXENT 300 MG/2ML ~~LOC~~ SOAJ: 300.0000 mg | SUBCUTANEOUS | 6 refills | Status: DC

## 2023-03-05 MED ORDER — BETAMETHASONE DIPROPIONATE 0.05 % EX LOTN: TOPICAL_LOTION | CUTANEOUS | 2 refills | Status: DC

## 2023-03-05 MED ORDER — PIMECROLIMUS 1 % EX CREA: TOPICAL_CREAM | CUTANEOUS | 4 refills | Status: DC

## 2023-03-05 MED ORDER — DUPILUMAB 300 MG/2ML ~~LOC~~ SOSY
600.0000 mg | PREFILLED_SYRINGE | Freq: Once | SUBCUTANEOUS | Status: AC
Start: 2023-03-05 — End: 2023-03-05
  Administered 2023-03-05: 600 mg via SUBCUTANEOUS

## 2023-03-05 NOTE — Progress Notes (Signed)
Follow-Up Visit   Subjective  Gina Ford is a 82 y.o. female who presents for the following: Prurigo Nodularis/LSC vs Psoriasis scalp, groin Clobetasol shampoo too expensive, Betamethasone dipropionate lotion every other day (helps when uses it but ran out), HC 2.5% groin every other day (less itching), hx of eczema as a child, has tried and failed elidel cr, Ketoconazole shampoo, mometasone solution, Derma-Smooth FS oil.  Spots are very itchy and she scratches at them. Pt has been diagnosed with osteoarthritis.   The following portions of the chart were reviewed this encounter and updated as appropriate: medications, allergies, medical history  Review of Systems:  No other skin or systemic complaints except as noted in HPI or Assessment and Plan.  Objective  Well appearing patient in no apparent distress; mood and affect are within normal limits.   A focused examination was performed of the following areas: Scalp, groin  Relevant exam findings are noted in the Assessment and Plan.    Assessment & Plan   PRURIGO NODULARIS/LSC  Scalp, inferior labia, perineal groin Exam: thickened plaque crown vertex, R parietal with shortened hairs, pink scaly papule L occipital scalp, groin improved per patient  Chronic and persistent condition with duration or expected duration over one year. Condition is bothersome/symptomatic for patient. Currently flared.   Treatment Plan: Start Dupixent 300mg /66ml sq injections  Dupixent 300mg /56ml sq injections x 2 today (R upper arm, R abdomen) Lot #[id number] exp 12/2024 Discussed IL Kenalog to aas scalp, pt declines due to getting steroid shot tomorrow in knee Cont Betamethasone Dipropionate lotion every day to aa scalp until clear, then prn flares, avoid f/g/a Cont HC 2.5% cream as needed once daily to aa groin for more severe flares Start Elidel cream daily to groin prn flares Avoid scratching/rubbing areas If condition doesn't improve with  Dupixent, will treat for psoriasis  Dupilumab (Dupixent) is a treatment given by injection for adults and children with moderate-to-severe atopic dermatitis. Goal is control of skin condition, not cure. It is given as 2 injections at the first dose followed by 1 injection ever 2 weeks thereafter.  Young children are dosed monthly.  Potential side effects include allergic reaction, herpes infections, injection site reactions and conjunctivitis (inflammation of the eyes).  The use of Dupixent requires long term medication management, including periodic office visits.   Topical steroids (such as triamcinolone, fluocinolone, fluocinonide, mometasone, clobetasol, halobetasol, betamethasone, hydrocortisone) can cause thinning and lightening of the skin if they are used for too long in the same area. Your physician has selected the right strength medicine for your problem and area affected on the body. Please use your medication only as directed by your physician to prevent side effects.    Prurigo nodularis  Related Medications dupilumab (DUPIXENT) prefilled syringe 600 mg   SEBORRHEIC DERMATITIS Face, scalp Exam: mild erythema and scale nasal alar crease, eyebrows   Seborrheic Dermatitis is a chronic persistent rash characterized by pinkness and scaling most commonly of the mid face but also can occur on the scalp (dandruff), ears; mid chest, mid back and groin.  It tends to be exacerbated by stress and cooler weather.  People who have neurologic disease may experience new onset or exacerbation of existing seborrheic dermatitis.  The condition is not curable but treatable and can be controlled.  Treatment Plan: Cont Ketoconazole 2% shampoo q shampoo to scalp, let sit 5 minutes and rinse out Restart Elidel cr every day aa itchy rash until clear, then prn flares  Return in  about 2 weeks (around 03/19/2023) for nurse for Dupixent injections, 4-6wks with Dr. Roseanne Reno for PN/LSC.  I, Ardis Rowan,  RMA, am acting as scribe for Willeen Niece, MD .   Documentation: I have reviewed the above documentation for accuracy and completeness, and I agree with the above.  Willeen Niece, MD

## 2023-03-05 NOTE — Patient Instructions (Signed)
Due to recent changes in healthcare laws, you may see results of your pathology and/or laboratory studies on MyChart before the doctors have had a chance to review them. We understand that in some cases there may be results that are confusing or concerning to you. Please understand that not all results are received at the same time and often the doctors may need to interpret multiple results in order to provide you with the best plan of care or course of treatment. Therefore, we ask that you please give us 2 business days to thoroughly review all your results before contacting the office for clarification. Should we see a critical lab result, you will be contacted sooner.   If You Need Anything After Your Visit  If you have any questions or concerns for your doctor, please call our main line at [phone] and press option 4 to reach your doctor's medical assistant. If no one answers, please leave a voicemail as directed and we will return your call as soon as possible. Messages left after 4 pm will be answered the following business day.   You may also send us a message via MyChart. We typically respond to MyChart messages within 1-2 business days.  For prescription refills, please ask your pharmacy to contact our office. Our fax number is [phone].  If you have an urgent issue when the clinic is closed that cannot wait until the next business day, you can page your doctor at the number below.    Please note that while we do our best to be available for urgent issues outside of office hours, we are not available 24/7.   If you have an urgent issue and are unable to reach us, you may choose to seek medical care at your doctor's office, retail clinic, urgent care center, or emergency room.  If you have a medical emergency, please immediately call 911 or go to the emergency department.  Pager Numbers  - Dr. Kowalski: [phone]  - Dr. Moye: [phone]  - Dr. Stewart:  [phone]  In the event of inclement weather, please call our main line at [phone] for an update on the status of any delays or closures.  Dermatology Medication Tips: Please keep the boxes that topical medications come in in order to help keep track of the instructions about where and how to use these. Pharmacies typically print the medication instructions only on the boxes and not directly on the medication tubes.   If your medication is too expensive, please contact our office at [phone] option 4 or send us a message through MyChart.   We are unable to tell what your co-pay for medications will be in advance as this is different depending on your insurance coverage. However, we may be able to find a substitute medication at lower cost or fill out paperwork to get insurance to cover a needed medication.   If a prior authorization is required to get your medication covered by your insurance company, please allow us 1-2 business days to complete this process.  Drug prices often vary depending on where the prescription is filled and some pharmacies may offer cheaper prices.  The website www.goodrx.com contains coupons for medications through different pharmacies. The prices here do not account for what the cost may be with help from insurance (it may be cheaper with your insurance), but the website can give you the price if you did not use any insurance.  - You can print the associated coupon and take it with   your prescription to the pharmacy.  - You may also stop by our office during regular business hours and pick up a GoodRx coupon card.  - If you need your prescription sent electronically to a different pharmacy, notify our office through [facility] MyChart or by phone at [phone] option 4.     Si Usted Necesita Algo Despus de Su Visita  Tambin puede enviarnos un mensaje a travs de MyChart. Por lo general respondemos a los mensajes de MyChart en el transcurso de 1 a 2  das hbiles.  Para renovar recetas, por favor pida a su farmacia que se ponga en contacto con nuestra oficina. Nuestro nmero de fax es el [phone].  Si tiene un asunto urgente cuando la clnica est cerrada y que no puede esperar hasta el siguiente da hbil, puede llamar/localizar a su doctor(a) al nmero que aparece a continuacin.   Por favor, tenga en cuenta que aunque hacemos todo lo posible para estar disponibles para asuntos urgentes fuera del horario de oficina, no estamos disponibles las 24 horas del da, los 7 das de la semana.   Si tiene un problema urgente y no puede comunicarse con nosotros, puede optar por buscar atencin mdica  en el consultorio de su doctor(a), en una clnica privada, en un centro de atencin urgente o en una sala de emergencias.  Si tiene una emergencia mdica, por favor llame inmediatamente al 911 o vaya a la sala de emergencias.  Nmeros de bper  - Dr. Kowalski: [phone]  - Dra. Moye: [phone]  - Dra. Stewart: [phone]  En caso de inclemencias del tiempo, por favor llame a nuestra lnea principal al [phone] para una actualizacin sobre el estado de cualquier retraso o cierre.  Consejos para la medicacin en dermatologa: Por favor, guarde las cajas en las que vienen los medicamentos de uso tpico para ayudarle a seguir las instrucciones sobre dnde y cmo usarlos. Las farmacias generalmente imprimen las instrucciones del medicamento slo en las cajas y no directamente en los tubos del medicamento.   Si su medicamento es muy caro, por favor, pngase en contacto con nuestra oficina llamando al [phone] y presione la opcin 4 o envenos un mensaje a travs de MyChart.   No podemos decirle cul ser su copago por los medicamentos por adelantado ya que esto es diferente dependiendo de la cobertura de su seguro. Sin embargo, es posible que podamos encontrar un medicamento sustituto a menor costo o llenar un formulario para que el  seguro cubra el medicamento que se considera necesario.   Si se requiere una autorizacin previa para que su compaa de seguros cubra su medicamento, por favor permtanos de 1 a 2 das hbiles para completar este proceso.  Los precios de los medicamentos varan con frecuencia dependiendo del lugar de dnde se surte la receta y alguna farmacias pueden ofrecer precios ms baratos.  El sitio web www.goodrx.com tiene cupones para medicamentos de diferentes farmacias. Los precios aqu no tienen en cuenta lo que podra costar con la ayuda del seguro (puede ser ms barato con su seguro), pero el sitio web puede darle el precio si no utiliz ningn seguro.  - Puede imprimir el cupn correspondiente y llevarlo con su receta a la farmacia.  - Tambin puede pasar por nuestra oficina durante el horario de atencin regular y recoger una tarjeta de cupones de GoodRx.  - Si necesita que su receta se enve electrnicamente a una farmacia diferente, informe a nuestra oficina a travs de MyChart de [place]   o por telfono llamando al [phone] y presione la opcin 4.  

## 2023-03-12 ENCOUNTER — Telehealth: Payer: Self-pay

## 2023-03-12 NOTE — Telephone Encounter (Signed)
Spoke with patient today. She wants to d/c Dupixent injections at this time. She has a $1,000 copay and also does not want injections twice monthly. She does not want to apply for patient assistance because she states she can afford that copay and would ranter not take away from someone who can not afford medicine who truly needs it. Patient is also not concerned with medication and condition with her age.

## 2023-03-19 ENCOUNTER — Ambulatory Visit: Payer: Medicare HMO

## 2023-04-02 ENCOUNTER — Ambulatory Visit: Payer: Medicare HMO | Attending: Cardiology | Admitting: Cardiology

## 2023-04-02 ENCOUNTER — Telehealth: Payer: Self-pay | Admitting: Cardiovascular Disease

## 2023-04-02 ENCOUNTER — Encounter: Payer: Self-pay | Admitting: Cardiology

## 2023-04-02 VITALS — BP 130/68 | HR 74 | Ht 62.0 in | Wt 145.4 lb

## 2023-04-02 DIAGNOSIS — E039 Hypothyroidism, unspecified: Secondary | ICD-10-CM

## 2023-04-02 DIAGNOSIS — I493 Ventricular premature depolarization: Secondary | ICD-10-CM

## 2023-04-02 DIAGNOSIS — E785 Hyperlipidemia, unspecified: Secondary | ICD-10-CM

## 2023-04-02 DIAGNOSIS — R079 Chest pain, unspecified: Secondary | ICD-10-CM | POA: Diagnosis not present

## 2023-04-02 DIAGNOSIS — R600 Localized edema: Secondary | ICD-10-CM

## 2023-04-02 DIAGNOSIS — I10 Essential (primary) hypertension: Secondary | ICD-10-CM

## 2023-04-02 NOTE — Patient Instructions (Signed)
Medication Instructions:  Your physician recommends that you continue on your current medications as directed. Please refer to the Current Medication list given to you today.  *If you need a refill on your cardiac medications before your next appointment, please call your pharmacy*   Lab Work: none If you have labs (blood work) drawn today and your tests are completely normal, you will receive your results only by: MyChart Message (if you have MyChart) OR A paper copy in the mail If you have any lab test that is abnormal or we need to change your treatment, we will call you to review the results.   Testing/Procedures: Your provider has ordered a Lexiscan/ Exercise Myoview Stress test. This will take place at [facility]. Please report to the [facility] medical mall entrance. The volunteers at the first desk will direct you where to go.  [facility] MYOVIEW  Your provider has ordered a Stress Test with nuclear imaging. The purpose of this test is to evaluate the blood supply to your heart muscle. This procedure is referred to as a "Non-Invasive Stress Test." This is because other than having an IV started in your vein, nothing is inserted or "invades" your body. Cardiac stress tests are done to find areas of poor blood flow to the heart by determining the extent of coronary artery disease (CAD). Some patients exercise on a treadmill, which naturally increases the blood flow to your heart, while others who are unable to walk on a treadmill due to physical limitations will have a pharmacologic/chemical stress agent called Lexiscan . This medicine will mimic walking on a treadmill by temporarily increasing your coronary blood flow.   Please note: these test may take anywhere between 2-4 hours to complete  How to prepare for your Myoview test:  Nothing to eat for 6 hours prior to the test No caffeine for 24 hours prior to test No smoking 24 hours prior to test. Your medication may be taken with water.  If your  doctor stopped a medication because of this test, do not take that medication. Ladies, please do not wear dresses.  Skirts or pants are appropriate. Please wear a short sleeve shirt. No perfume, cologne or lotion. Wear comfortable walking shoes. No heels!   PLEASE NOTIFY THE OFFICE AT LEAST 24 HOURS IN ADVANCE IF YOU ARE UNABLE TO KEEP YOUR APPOINTMENT.  [phone] AND  PLEASE NOTIFY NUCLEAR MEDICINE AT [facility] AT LEAST 24 HOURS IN ADVANCE IF YOU ARE UNABLE TO KEEP YOUR APPOINTMENT. [phone]    Follow-Up: At [facility], you and your health needs are our priority.  As part of our continuing mission to provide you with exceptional heart care, we have created designated Provider Care Teams.  These Care Teams include your primary Cardiologist (physician) and Advanced Practice Providers (APPs -  Physician Assistants and Nurse Practitioners) who all work together to provide you with the care you need, when you need it.  We recommend signing up for the patient portal called "MyChart".  Sign up information is provided on this After Visit Summary.  MyChart is used to connect with patients for Virtual Visits (Telemedicine).  Patients are able to view lab/test results, encounter notes, upcoming appointments, etc.  Non-urgent messages can be sent to your provider as well.   To learn more about what you can do with MyChart, go to [email or web address].    Your next appointment:   3 month f/u after Myoview Lexiscan   Provider:   Lorine Bears, MD

## 2023-04-02 NOTE — Telephone Encounter (Signed)
Patient has appt scheduled today with [facility]. Patient states she has  been experiencing really bad chest pain for approximately 4 weeks. Patient denies Chest pain, SOB, N/V at the moment. Patient states during one of her episodes of chest pain she experienced Right shoulder pain radiating down her arm. Patient reports taking aspirin and tylenol occasionally.

## 2023-04-02 NOTE — Telephone Encounter (Signed)
   Pt c/o of Chest Pain: STAT if active CP, including tightness, pressure, jaw pain, radiating pain to shoulder/upper arm/back, CP unrelieved by Nitro. Symptoms reported of SOB, nausea, vomiting, sweating.  1. Are you having CP right now? Not at this time- had some last night - been having them for the last 4 weeks   2. Are you experiencing any other symptoms (ex. SOB, nausea, vomiting, sweating)?  no   3. Is your CP continuous or coming and going? Comes and goes   4. Have you taken Nitroglycerin? No, took baby aspirins   5. How long have you been experiencing CP? About 4 weeks    6. If NO CP at time of call then end call with telling Pt to call back or call 911 if Chest pain returns prior to return call from triage team.-

## 2023-04-02 NOTE — Progress Notes (Signed)
Cardiology Office Note:  .   Date:  04/02/2023  ID:  Gina Ford, DOB 05/07/1941, MRN [id number] PCP: Enid Baas, MD  [facility] Providers Cardiologist:  None    History of Present Illness: .   Gina Ford is a 82 y.o. female with past medical history of essential hypertension, hyperlipidemia, hypothyroidism, chronic venous insufficiency, and PVCs, who was called in to be seen with complaints of chest pain for the past 4 weeks.  She started with complaints of chest pain in 2012.  She underwent a treadmill nuclear stress test which showed no evidence of ischemia with normal ejection fraction.  She was noted to have mild palpitations.  ZIO monitor in 2021 showed normal sinus rhythm with an average heart rate of 71 bpm.  There were occasional PVCs (2.7% with one 10 beat run of wide-complex tachycardia.  She proceeded with a Lexiscan Myoview which showed no evidence of ischemia with normal ejection fraction.  Echocardiogram showed an LVEF of 60 to 65% with G1 DD, mild mitral regurgitation, mild to moderate aortic regurgitation.  She was last seen in clinic 03/15/2022 by Dr Kirke Corin.  At that time she had an uncontrolled hypertension in spite of amlodipine and maximum dose losartan.  Renal artery duplex was done and showed no renal artery stenosis.  Blood pressure improved with addition of small dose of HCTZ.  Amlodipine had been increased by her PCP to 7.5 mg once daily but she had worsening leg edema recently given her husband's furosemide.  She was recommended for compression stockings and either decreasing amlodipine or increasing HCTZ if her leg edema became persistent.  Patient called the triage line today stating that she had been having  chest pain with the last episode being last evening.  It has been off and on for the past 4 weeks.  She states she did not experience any other associated symptoms.  The chest pain was coming and going.  She did not take any Nitrostat but did take  baby aspirin's.  She stated that during one of her episodes of chest pain she experienced right shoulder pain radiating down her arm.  She returns to clinic today  ROS: 10 point review of systems has been completed and considered negative with exception of what is been listed in the HPI  Studies Reviewed: Marland Kitchen   EKG Interpretation Date/Time:  Tuesday April 02 2023 14:25:39 EDT Ventricular Rate:  68 PR Interval:  170 QRS Duration:  82 QT Interval:  404 QTC Calculation: 429 R Axis:   10  Text Interpretation: Sinus rhythm with occasional Premature ventricular complexes Septal infarct , age undetermined When compared with ECG of 11-Sep-2010 10:45, Premature ventricular complexes are now Present Septal infarct is now Present Confirmed by Charlsie Quest ([id number]) on 04/02/2023 3:24:43 PM   Lexiscan MPI 11/02/19 There was no ST segment deviation noted during stress. No T wave inversion was noted during stress. The study is normal. This is a low risk study. The left ventricular ejection fraction is normal (55-65%).  TTE 10/15/19 1. Left ventricular ejection fraction, by estimation, is 60 to 65%. The  left ventricle has normal function. The left ventricle has no regional  wall motion abnormalities. Left ventricular diastolic parameters are  consistent with Grade I diastolic  dysfunction (impaired relaxation).   2. Right ventricular systolic function is normal. The right ventricular  size is normal.   3. Left atrial size was mildly dilated.   4. Mild mitral valve regurgitation.   5.  Aortic valve regurgitation is mild to moderate.  Risk Assessment/Calculations:             Physical Exam:   VS:  BP 130/68 (BP Location: Left Arm, Patient Position: Sitting, Cuff Size: Normal)   Pulse 74   Ht 5\' 2"  (1.575 m)   Wt 145 lb 6.4 oz (66 kg)   SpO2 97%   BMI 26.59 kg/m    Wt Readings from Last 3 Encounters:  04/02/23 145 lb 6.4 oz (66 kg)  03/15/22 147 lb 2 oz (66.7 kg)  03/02/21 142 lb (64.4 kg)     GEN: Well nourished, well developed in no acute distress NECK: No JVD; No carotid bruits CARDIAC: RRR, no murmurs, rubs, gallops RESPIRATORY:  Clear to auscultation without rales, wheezing or rhonchi  ABDOMEN: Soft, non-tender, non-distended EXTREMITIES:  No edema; No deformity   ASSESSMENT AND PLAN: .   Atypical chest pain that she said has been on and off for the last 4 weeks consistently.  She states that she had had several episodes prior to that and is unsure if the chest tightness and pressure that she has that radiates into her right shoulder and down her right arm discomfort.  Stress or her hiatal hernia.  Last Myoview was in 2021.  She has been scheduled for repeat study to evaluate her symptoms.  EKG today revealed sinus rhythm with unifocal PVCs and a new potential septal infarct that was not noted on previous studies.  Essential hypertension with blood pressure 130/68 today.  Blood pressure remained stable.  She is continued on Norvasc 5 mg daily and valsartan/HCTZ 320/12.5 mg daily.  Encouraged to continue to monitor blood pressures at home as well.  Hyperlipidemia with last LDL of 104 in 12/23.  She is continued on pravastatin 40 mg daily.  This continues to be monitored by her PCP.  Hypothyroidism on levothyroxine this continues to be followed by her PCP with the last TSH 6.448.  Bilateral leg edema likely from chronic venous insufficiency and she has varicose veins CEAP class III.  I discussed conservative therapy.  Patient is not agreeable to compression stockings.    Informed Consent   Shared Decision Making/Informed Consent The risks [chest pain, shortness of breath, cardiac arrhythmias, dizziness, blood pressure fluctuations, myocardial infarction, stroke/transient ischemic attack, nausea, vomiting, allergic reaction, radiation exposure, metallic taste sensation and life-threatening complications (estimated to be 1 in 10,000)], benefits (risk stratification, diagnosing  coronary artery disease, treatment guidance) and alternatives of a nuclear stress test were discussed in detail with Ms. Nucci and she agrees to proceed.     Dispo: Patient return to clinic to see primary cardiologist Dr. Renato Gails in 3 months or sooner to reevaluate symptoms after testing has been completed.  Signed, Margel Joens, NP

## 2023-04-09 ENCOUNTER — Ambulatory Visit: Payer: Medicare HMO | Admitting: Dermatology

## 2023-04-09 ENCOUNTER — Encounter: Payer: Self-pay | Admitting: Dermatology

## 2023-04-09 VITALS — BP 137/77 | HR 60

## 2023-04-09 DIAGNOSIS — L219 Seborrheic dermatitis, unspecified: Secondary | ICD-10-CM

## 2023-04-09 DIAGNOSIS — L281 Prurigo nodularis: Secondary | ICD-10-CM

## 2023-04-09 DIAGNOSIS — L280 Lichen simplex chronicus: Secondary | ICD-10-CM | POA: Diagnosis not present

## 2023-04-09 MED ORDER — KETOCONAZOLE 2 % EX SHAM
MEDICATED_SHAMPOO | CUTANEOUS | Status: DC
Start: 2023-04-09 — End: 2024-04-16

## 2023-04-09 MED ORDER — HYDROCORTISONE 2.5 % EX CREA: TOPICAL_CREAM | CUTANEOUS | 3 refills | Status: DC

## 2023-04-09 NOTE — Progress Notes (Signed)
   Follow-Up Visit   Subjective  Gina Ford is a 82 y.o. 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 She does not want further treatment, does not want to be examined today.  Decided not to take Dupixent. 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 Hasn't been picking as much   The following portions of the chart were reviewed this encounter and updated as appropriate: medications, allergies, medical history  Review of Systems:  No other skin or systemic complaints except as noted in HPI or Assessment and Plan.  Objective  Well appearing patient in no apparent distress; mood and affect are within normal limits.   A focused examination was performed of the following areas: groin  Relevant exam findings are noted in the Assessment and Plan.    Assessment & Plan    PRURIGO NODULARIS/LSC  Scalp, inferior labia, perineal groin  Exam: thickened plaque crown vertex, R parietal with shortened hairs, pink scaly papule L occipital scalp, groin improved per patient.   Lichen simplex chronicus [facility]) is a persistent itchy area of thickened skin that is induced by chronic rubbing and/or scratching (chronic dermatitis).  These areas may be pink, hyperpigmented and may have excoriations.  LSC is commonly observed in uncontrolled atopic dermatitis and other forms of eczema, and in other itchy skin conditions (eg, insect bites, scabies).  Sometimes it is not possible to know initial cause of LSC if it has been present for a long time.  It generally responds well to treatment with high potency topical steroids.  It is important to stop rubbing/scratching the area in order to break the itch-scratch-rash-itch cycle, in order for the rash to resolve.   Chronic condition with duration or  expected duration over one year. Currently well-controlled.   Treatment Plan:  Continue HC 2.5% cream to aas groin every day/bid prn itch   SEBORRHEIC DERMATITIS Exam: Light tan plaque post crown with thinning and shortened hairs.  Chronic condition with duration or expected duration over one year. Currently well-controlled.   Seborrheic Dermatitis is a chronic persistent rash characterized by pinkness and scaling most commonly of the mid face but also can occur on the scalp (dandruff), ears; mid chest, mid back and groin.  It tends to be exacerbated by stress and cooler weather.  People who have neurologic disease may experience new onset or exacerbation of existing seborrheic dermatitis.  The condition is not curable but treatable and can be controlled.  Treatment Plan: -Continue Ketoconazole 2% shampoo to scalp every other day  -Contiue Tgel shampoo as directed, alternating with above  Seborrheic dermatitis  Related Medications ketoconazole (NIZORAL) 2 % shampoo     Return if symptoms worsen or fail to improve.  I, Tillie Fantasia, CMA, am acting as scribe for Willeen Niece, MD.   Documentation: I have reviewed the above documentation for accuracy and completeness, and I agree with the above.  Willeen Niece, MD

## 2023-04-09 NOTE — Patient Instructions (Signed)
Due to recent changes in healthcare laws, you may see results of your pathology and/or laboratory studies on MyChart before the doctors have had a chance to review them. We understand that in some cases there may be results that are confusing or concerning to you. Please understand that not all results are received at the same time and often the doctors may need to interpret multiple results in order to provide you with the best plan of care or course of treatment. Therefore, we ask that you please give us 2 business days to thoroughly review all your results before contacting the office for clarification. Should we see a critical lab result, you will be contacted sooner.   If You Need Anything After Your Visit  If you have any questions or concerns for your doctor, please call our main line at [phone] If no one answers, please leave a voicemail as directed and we will return your call as soon as possible. Messages left after 4 pm will be answered the following business day.   You may also send us a message via MyChart. We typically respond to MyChart messages within 1-2 business days.  For prescription refills, please ask your pharmacy to contact our office. Our fax number is [phone].  If you have an urgent issue when the clinic is closed that cannot wait until the next business day, you can page your doctor at the number below.    Please note that while we do our best to be available for urgent issues outside of office hours, we are not available 24/7.   If you have an urgent issue and are unable to reach us, you may choose to seek medical care at your doctor's office, retail clinic, urgent care center, or emergency room.  If you have a medical emergency, please immediately call 911 or go to the emergency department. In the event of inclement weather, please call our main line at [phone] for an update on the status of any delays or closures.  Dermatology Medication Tips: Please  keep the boxes that topical medications come in in order to help keep track of the instructions about where and how to use these. Pharmacies typically print the medication instructions only on the boxes and not directly on the medication tubes.   If your medication is too expensive, please contact our office at [phone] or send us a message through MyChart.   We are unable to tell what your co-pay for medications will be in advance as this is different depending on your insurance coverage. However, we may be able to find a substitute medication at lower cost or fill out paperwork to get insurance to cover a needed medication.   If a prior authorization is required to get your medication covered by your insurance company, please allow us 1-2 business days to complete this process.  Drug prices often vary depending on where the prescription is filled and some pharmacies may offer cheaper prices.  The website www.goodrx.com contains coupons for medications through different pharmacies. The prices here do not account for what the cost may be with help from insurance (it may be cheaper with your insurance), but the website can give you the price if you did not use any insurance.  - You can print the associated coupon and take it with your prescription to the pharmacy.  - You may also stop by our office during regular business hours and pick up a GoodRx coupon card.  - If you need your   prescription sent electronically to a different pharmacy, notify our office through [facility] MyChart or by phone at [phone]     

## 2023-05-14 ENCOUNTER — Ambulatory Visit
Admission: RE | Admit: 2023-05-14 | Discharge: 2023-05-14 | Disposition: A | Discharge status: Home / Self Care | Payer: Medicare HMO | Source: Ambulatory Visit | Attending: Cardiology | Admitting: Cardiology

## 2023-05-14 DIAGNOSIS — R079 Chest pain, unspecified: Secondary | ICD-10-CM | POA: Insufficient documentation

## 2023-05-14 MED ORDER — TECHNETIUM TC 99M TETROFOSMIN IV KIT
32.1600 | PACK | Freq: Once | INTRAVENOUS | Status: AC | PRN
  Administered 2023-05-14: 32.16 via INTRAVENOUS

## 2023-05-14 MED ORDER — REGADENOSON 0.4 MG/5ML IV SOLN
0.4000 mg | Freq: Once | INTRAVENOUS | Status: AC
  Administered 2023-05-14: 0.4 mg via INTRAVENOUS

## 2023-05-14 MED ORDER — TECHNETIUM TC 99M TETROFOSMIN IV KIT
10.7700 | PACK | Freq: Once | INTRAVENOUS | Status: AC | PRN
  Administered 2023-05-14: 10.77 via INTRAVENOUS

## 2023-05-15 LAB — NM MYOCAR MULTI W/SPECT W/WALL MOTION / EF
LV dias vol: 70 mL (ref 46–106)
LV sys vol: 32 mL
Nuc Stress EF: 70 %
Peak HR: 92 {beats}/min
Rest HR: 54 {beats}/min
Rest Nuclear Isotope Dose: 10.8 mCi
SDS: 2
SRS: 5
SSS: 9
ST Depression (mm): 0 mm
Stress Nuclear Isotope Dose: 32.2 mCi
TID: 0.97

## 2023-05-15 NOTE — Progress Notes (Signed)
Normal perfusion stress test. No evidence of significant ischemia or scar. No significant change noted from study completed om 2021.

## 2023-07-03 ENCOUNTER — Other Ambulatory Visit: Payer: Self-pay | Admitting: Dermatology

## 2023-07-03 DIAGNOSIS — L409 Psoriasis, unspecified: Secondary | ICD-10-CM

## 2023-08-13 ENCOUNTER — Encounter: Payer: Self-pay | Admitting: Cardiovascular Disease

## 2023-08-13 ENCOUNTER — Ambulatory Visit: Payer: Medicare HMO | Attending: Cardiovascular Disease | Admitting: Cardiovascular Disease

## 2023-08-13 VITALS — BP 128/80 | HR 63 | Ht 62.0 in | Wt 145.5 lb

## 2023-08-13 DIAGNOSIS — E785 Hyperlipidemia, unspecified: Secondary | ICD-10-CM

## 2023-08-13 DIAGNOSIS — I493 Ventricular premature depolarization: Secondary | ICD-10-CM | POA: Diagnosis not present

## 2023-08-13 DIAGNOSIS — I10 Essential (primary) hypertension: Secondary | ICD-10-CM | POA: Diagnosis not present

## 2023-08-13 MED ORDER — AMLODIPINE BESYLATE 5 MG PO TABS: 5.0000 mg | ORAL_TABLET | Freq: Every day | ORAL | 3 refills | Status: DC

## 2023-08-13 NOTE — Patient Instructions (Addendum)
Medication Instructions:  DECREASE the Amlodipine to 5 mg once daily  *If you need a refill on your cardiac medications before your next appointment, please call your pharmacy*   Lab Work: None ordered If you have labs (blood work) drawn today and your tests are completely normal, you will receive your results only by: MyChart Message (if you have MyChart) OR A paper copy in the mail If you have any lab test that is abnormal or we need to change your treatment, we will call you to review the results.   Testing/Procedures: None ordered   Follow-Up: At [facility], you and your health needs are our priority.  As part of our continuing mission to provide you with exceptional heart care, we have created designated Provider Care Teams.  These Care Teams include your primary Cardiologist (physician) and Advanced Practice Providers (APPs -  Physician Assistants and Nurse Practitioners) who all work together to provide you with the care you need, when you need it.  We recommend signing up for the patient portal called "MyChart".  Sign up information is provided on this After Visit Summary.  MyChart is used to connect with patients for Virtual Visits (Telemedicine).  Patients are able to view lab/test results, encounter notes, upcoming appointments, etc.  Non-urgent messages can be sent to your provider as well.   To learn more about what you can do with MyChart, go to [email or web address].    Your next appointment:   12 month(s)  Provider:   You may see Dr. Kirke Corin or one of the following Advanced Practice Providers on your designated Care Team:   Nicolasa Ducking, NP Eula Listen, [name] Cadence Fransico Michael, [name] Charlsie Quest, NP Carlos Levering, NP

## 2023-08-13 NOTE — Progress Notes (Signed)
Cardiology Office Note   Date:  08/13/2023   ID:  Gina Ford, DOB 06-Feb-1941, MRN [id number]  PCP:  Enid Baas, MD  Cardiologist:   Lorine Bears, MD   Chief Complaint  Patient presents with   Follow-up    3 month f/u myoview c/o when laying on side she feels rapid heart beat. Meds reviewed verbally with pt.      History of Present Illness: Gina Ford is a 82 y.o. female who is here today for follow-up visit regarding PVCs and uncontrolled hypertension.   She has known history of essential hypertension, hyperlipidemia and hypothyroidism on replacement therapy with levothyroxine. She was evaluated by our group in 2012 for atypical chest pain.  She underwent a treadmill nuclear stress test which showed no evidence of ischemia with normal ejection fraction. She is known to have mild palpitations.  ZIO monitor in 2021 showed normal sinus rhythm with an average heart rate of 71 bpm.  There were occasional PVCs with a burden of 2.7% with one 10 beat run of wide-complex tachycardia.  Due to that, I proceeded with a Lexiscan Myoview which showed no evidence of ischemia with normal ejection fraction.  Echocardiogram showed an EF of 60 to 65% with grade 1 diastolic dysfunction, mild mitral regurgitation and mild to moderate aortic regurgitation.  Renal artery duplex in the past showed no evidence of renal artery stenosis. She has been doing well with no chest pain, shortness of breath or orthopnea.  She reports minimal palpitations mostly happening at night. She moved to [facility] this summer.  She was under stress and had some chest pain.  She had a Lexiscan Myoview that showed no evidence of ischemia.  Amlodipine was increased to 10 mg daily due to elevated blood pressure but lower extremity edema worsening.  Past Medical History:  Diagnosis Date   Abnormal mammogram    NEED R DIAGNO MAMMO 3/12   Achilles tendonitis    B 2ND AND 3RD MTP SYNOVITIS DR. [id number]   Actinic  keratosis    HAND 6/11 KELLIE SHEFFIELD, [place]   Allergic rhinitis    Anemia    Arthritis    GERD (gastroesophageal reflux disease)    Glaucoma    HLD (hyperlipidemia)    HTN (hypertension)    Hyperthyroidism    Low serum vitamin D 01/2015   Obesity     Past Surgical History:  Procedure Laterality Date   COLONOSCOPY     ESOPHAGOGASTRODUODENOSCOPY  2012, 2016   INGUINAL HERNIA REPAIR Left    KNEE ARTHROSCOPY     LOBECTOMY     Right Thyroid, then Left   THYROIDECTOMY  1991   TUBAL LIGATION     VARICOSE VEIN SURGERY       Current Outpatient Medications  Medication Sig Dispense Refill   amLODipine (NORVASC) 10 MG tablet Take 10 mg by mouth daily.     betamethasone dipropionate 0.05 % lotion Apply topically as directed. Every day to aa scalp until clear, then prn flares, avoid face, groin, axilla 45 mL 2   gabapentin (NEURONTIN) 100 MG capsule Take by mouth.     hydrocortisone (ANUSOL-HC) 2.5 % rectal cream Place 1 application rectally 2 (two) times daily as needed for hemorrhoids or itching.     hydrocortisone 2.5 % cream Apply twice daily to affected areas groin until improved. 30 g 3   ketoconazole (NIZORAL) 2 % shampoo MASSAGE INTO SCALP, LET SIT SEVERAL MINUTES BEFORE RINSING. 120 mL 3  latanoprost (XALATAN) 0.005 % ophthalmic solution Place 1 drop into both eyes at bedtime.     levothyroxine (SYNTHROID, LEVOTHROID) 100 MCG tablet Take 100 mcg by mouth daily before breakfast.     pimecrolimus (ELIDEL) 1 % cream Apply topically as directed. Every day to itchy scaly rash on face until clear, then as needed for flares 30 g 4   pravastatin (PRAVACHOL) 40 MG tablet Take 40 mg by mouth daily.     TIMOLOL HEMIHYDRATE OP Apply 1 drop to eye daily.     valsartan-hydrochlorothiazide (DIOVAN-HCT) 320-12.5 MG tablet Take 1 tablet by mouth daily.     Vitamin D, Ergocalciferol, (DRISDOL) 50000 units CAPS capsule Take 50,000 Units by mouth every 7 (seven) days.     Current  Facility-Administered Medications  Medication Dose Route Frequency Provider Last Rate Last Admin   ketoconazole (NIZORAL) 2 % shampoo   Topical Hughes Better Willeen Niece, MD        Allergies:   Doxazosin, Doxazosin mesylate, and Univasc [moexipril]    Social History:  The patient  reports that she has never smoked. She has never used smokeless tobacco. She reports current alcohol use. She reports that she does not use drugs.   Family History:  The patient's family history includes Arthritis in her father and mother; CVA in her father and mother; Colon polyps in her mother; Heart attack in an other family member; Heart attack (age of onset: 49) in her father; Hypertension in her mother; Leukemia (age of onset: 30) in her sister; Melanoma in her sister; Stroke in her father; Stroke (age of onset: 4) in her mother.    ROS:  Please see the history of present illness.   Otherwise, review of systems are positive for none.   All other systems are reviewed and negative.    PHYSICAL EXAM: VS:  BP 128/80 (BP Location: Left Arm, Patient Position: Sitting, Cuff Size: Normal)   Pulse 63   Ht 5\' 2"  (1.575 m)   Wt 145 lb 8 oz (66 kg)   SpO2 98%   BMI 26.61 kg/m  , BMI Body mass index is 26.61 kg/m. GEN: Well nourished, well developed, in no acute distress  HEENT: normal  Neck: no JVD, carotid bruits, or masses Cardiac: RRR; no rubs, or gallops, mild bilateral leg edema .  1 out of 6 systolic murmur in the aortic area. Respiratory:  clear to auscultation bilaterally, normal work of breathing GI: soft, nontender, nondistended, + BS MS: no deformity or atrophy  Skin: warm and dry, no rash Neuro:  Strength and sensation are intact Psych: euthymic mood, full affect   EKG:  EKG is ordered today. EKG showed: Normal sinus rhythm Septal infarct (cited on or before 02-Apr-2023) When compared with ECG of 02-Apr-2023 14:25, Premature ventricular complexes are no longer Present   Recent Labs: No results  found for requested labs within last 365 days.    Lipid Panel No results found for: "CHOL", "TRIG", "HDL", "CHOLHDL", "VLDL", "LDLCALC", "LDLDIRECT"    Wt Readings from Last 3 Encounters:  08/13/23 145 lb 8 oz (66 kg)  04/02/23 145 lb 6.4 oz (66 kg)  03/15/22 147 lb 2 oz (66.7 kg)           09/29/2019    3:20 PM  PAD Screen  Previous PAD dx? No  Previous surgical procedure? No  Pain with walking? No  Feet/toe relief with dangling? No  Painful, non-healing ulcers? No  Extremities discolored? No  ASSESSMENT AND PLAN:  1.  Asymptomatic PVCs: Overall burden was relatively low and she is currently with no symptoms.  Continue observation.  No need for medications especially with baseline bradycardia.  2.  Essential hypertension: Blood pressure is controlled but she is complaining of worsening lower extremity edema since amlodipine was increased to 10 mg daily.  I decreased the dose back to 5 mg once daily.  If her blood pressure goes up, recommend increasing the dose of hydrochlorothiazide to 25 mg daily.  3.  Hyperlipidemia: Currently on pravastatin 40 mg daily.       Disposition:   FU in 12 months.  Signed,  Lorine Bears, MD  08/13/2023 1:43 PM    [facility]

## 2023-09-27 DIAGNOSIS — E538 Deficiency of other specified B group vitamins: Secondary | ICD-10-CM | POA: Diagnosis not present

## 2023-10-27 ENCOUNTER — Other Ambulatory Visit: Payer: Self-pay | Admitting: Dermatology

## 2023-10-27 DIAGNOSIS — L409 Psoriasis, unspecified: Secondary | ICD-10-CM

## 2023-10-28 DIAGNOSIS — E538 Deficiency of other specified B group vitamins: Secondary | ICD-10-CM | POA: Diagnosis not present

## 2023-11-28 DIAGNOSIS — E538 Deficiency of other specified B group vitamins: Secondary | ICD-10-CM | POA: Diagnosis not present

## 2023-12-09 ENCOUNTER — Telehealth: Payer: Self-pay | Admitting: Cardiovascular Disease

## 2023-12-09 NOTE — Telephone Encounter (Signed)
 Pt called in stating her hr has been beating irregular. She states "sometimes its slow and sometimes its fast". She states she did not have any readings written down and she didn't have a range of what its been. She denies any other symptoms. Please advise.

## 2023-12-09 NOTE — Progress Notes (Unsigned)
 Cardiology Office Note    Date:  12/10/2023   ID:  Gina Ford, DOB 10/26/1940, MRN [id number]  PCP:  Enid Baas, MD  Cardiologist:  Lorine Bears, MD  Electrophysiologist:  None   Chief Complaint: Palpitations  History of Present Illness:   Gina Ford is a 83 y.o. female with history of CAD, aortic atherosclerosis, PVCs, HTN, HLD, large hiatal hernia, and hypothyroidism on replacement therapy who presents for evaluation of palpitations.  She was evaluated in 2012 for atypical chest pain and underwent treadmill MPI that showed no evidence of ischemia with a normal EF.  Zio patch in 2021 showed sinus rhythm with an average rate of 71 bpm.  There were occasional PVCs with an overall burden of 2.7% as well as one 10-beat run of WCT.  In this setting she underwent Lexiscan MPI that showed no evidence of ischemia with a normal EF.  Echo showed an EF of 60 to 65%, grade 1 diastolic dysfunction, mild mitral regurgitation, and mild to moderate aortic insufficiency.  Prior renal artery duplex has shown no evidence of RAS.  In the summer 2024 she was under increased stress and had some chest pain.  She subsequently underwent Lexiscan MPI in 05/2023 that showed no evidence of ischemia or scar with an EF greater than 65% and was overall low risk.  Coronary artery calcification and aortic atherosclerosis noted on CT attenuation corrected imaging.  Overall, this was a low risk study.  She contacted our office on 12/09/2023 noting an 8 to 46-month history of intermittent palpitations that were more noticeable more recently.  Symptoms were more prominent at rest and during the night and at times were associated with shortness of breath.  She reported her Fitbit indicated heart rate rising into the 120s bpm.  Given this, appointment was scheduled for today.  She comes in noting an episode of mild chest discomfort that occurred at rest prompting her to look at her Fitbit, at which time the device  reported a heart rate of 120 bpm.  She was without associated symptoms.  She does occasionally continue to note intermittent palpitations that are more noticeable at nighttime when laying down.  She remains active at baseline typically walking 5000-6000 steps per day without cardiac limitation.  She drinks 1 cup of coffee each morning and an occasional rare soda.  No presyncope or syncope.   Labs independently reviewed: 02/2023 - potassium 4.6, BUN 14, serum creatinine 0.8, albumin 4.2, AST/ALT normal, TSH 6.448, free T4 elevated at 1.27, A1c 6.3 08/2022 - Hgb 13.4, PLT 360, TC 198, TG 109, HDL 72, LDL 104  Past Medical History:  Diagnosis Date   Abnormal mammogram    NEED R DIAGNO MAMMO 3/12   Achilles tendonitis    B 2ND AND 3RD MTP SYNOVITIS DR. NUUVOZD   Actinic keratosis    HAND 6/11 [facility] [place] [place]   Allergic rhinitis    Anemia    Arthritis    GERD (gastroesophageal reflux disease)    Glaucoma    HLD (hyperlipidemia)    HTN (hypertension)    Hyperthyroidism    Low serum vitamin D 01/2015   Obesity     Past Surgical History:  Procedure Laterality Date   COLONOSCOPY     ESOPHAGOGASTRODUODENOSCOPY  2012, 2016   INGUINAL HERNIA REPAIR Left    KNEE ARTHROSCOPY     LOBECTOMY     Right Thyroid, then Left   THYROIDECTOMY  1991   TUBAL LIGATION  VARICOSE VEIN SURGERY      Current Medications: Current Meds  Medication Sig   amLODipine (NORVASC) 5 MG tablet Take 1 tablet (5 mg total) by mouth daily.   betamethasone dipropionate 0.05 % lotion Apply topically as directed. Every day to aa scalp until clear, then prn flares, avoid face, groin, axilla   cyanocobalamin (VITAMIN B12) 1000 MCG/ML injection Inject 1,000 mcg into the muscle every 30 (thirty) days.   gabapentin (NEURONTIN) 100 MG capsule Take by mouth.   ketoconazole (NIZORAL) 2 % shampoo MASSAGE INTO SCALP, LET SIT SEVERAL MINUTES BEFORE RINSING.   latanoprost (XALATAN) 0.005 % ophthalmic solution Place 1 drop  into both eyes at bedtime.   levothyroxine (SYNTHROID, LEVOTHROID) 100 MCG tablet Take 100 mcg by mouth daily before breakfast.   Loratadine (CLARITIN PO) Take 1 tablet by mouth every other day.   pimecrolimus (ELIDEL) 1 % cream Apply topically as directed. Every day to itchy scaly rash on face until clear, then as needed for flares   pravastatin (PRAVACHOL) 40 MG tablet Take 40 mg by mouth daily.   TIMOLOL HEMIHYDRATE OP Apply 1 drop to eye daily.   valsartan-hydrochlorothiazide (DIOVAN-HCT) 320-12.5 MG tablet Take 1 tablet by mouth daily.   Vitamin D, Ergocalciferol, (DRISDOL) 50000 units CAPS capsule Take 50,000 Units by mouth every 7 (seven) days.   Current Facility-Administered Medications for the 12/10/23 encounter (Office Visit) with Sondra Barges, [name]  Medication   ketoconazole (NIZORAL) 2 % shampoo    Allergies:   Doxazosin, Doxazosin mesylate, and Univasc [moexipril]   Social History   Socioeconomic History   Marital status: Married    Spouse name: Not on file   Number of children: 1   Years of education: Not on file   Highest education level: Not on file  Occupational History   Occupation: RETIRED  Tobacco Use   Smoking status: Never   Smokeless tobacco: Never  Vaping Use   Vaping status: Never Used  Substance and Sexual Activity   Alcohol use: Yes    Comment: glass of wine rarely   Drug use: No   Sexual activity: Not on file  Other Topics Concern   Not on file  Social History Narrative   Not on file   Social Drivers of Health   Financial Resource Strain: Low Risk  (07/08/2023)   Received from [facility]   Overall Financial Resource Strain (CARDIA)    Difficulty of Paying Living Expenses: Not hard at all  Food Insecurity: No Food Insecurity (07/08/2023)   Received from [facility]   Hunger Vital Sign    Ran Out of Food in the Last Year: Never true    Worried About Running Out of Food in the Last Year: Never true   Transportation Needs: No Transportation Needs (07/08/2023)   Received from [facility]   PRAPARE - Transportation    Lack of Transportation (Non-Medical): No    In the past 12 months, has lack of transportation kept you from medical appointments or from getting medications?: No  Physical Activity: Not on file  Stress: Not on file  Social Connections: Not on file     Family History:  The patient's family history includes Arthritis in her father and mother; CVA in her father and mother; Colon polyps in her mother; Heart attack in an other family member; Heart attack (age of onset: 63) in her father; Hypertension in her mother; Leukemia (age of onset: 40) in her sister;  Melanoma in her sister; Stroke in her father; Stroke (age of onset: 58) in her mother.  ROS:   12-point review of systems is negative unless otherwise noted in the HPI.   EKGs/Labs/Other Studies Reviewed:    Studies reviewed were summarized above. The additional studies were reviewed today:  Lexiscan MPI 05/14/2023:   Normal pharmacologic myocardial perfusion stress test without evidence of significant ischemia or scar.   Left ventricular systolic function is normal (LVEF greater than 65%).   No significant coronary artery calcification is identified.  Aortic atherosclerosis is noted.   Large hiatal hernia is present, similar to the CT abdomen and pelvis from 08/13/2022.   No significant change noted compared to prior study from 11/02/2019.   This is a low risk study. __________  Renal artery ultrasound/12/2020: Summary:  Largest Aortic Diameter: 2.1 cm    Renal:    Right: Normal size right kidney. Abnormal right Resistive Index.         Normal cortical thickness of right kidney. Cyst(s) noted. RRV         flow present. 1-59% stenosis of the right renal artery.  Left:  Normal size of left kidney. Abnormal left Resisitve Index.         Normal cortical thickness of the left kidney. LRV flow          present. 1-59% stenosis of the left renal artery.  Mesenteric:  Normal Celiac artery and Superior Mesenteric artery findings.  __________  Eugenie Birks MPI 11/02/2019:   There was no ST segment deviation noted during stress. No T wave inversion was noted during stress. The study is normal. This is a low risk study. The left ventricular ejection fraction is normal (55-65%). __________  2D echo 10/15/2019: 1. Left ventricular ejection fraction, by estimation, is 60 to 65%. The  left ventricle has normal function. The left ventricle has no regional  wall motion abnormalities. Left ventricular diastolic parameters are  consistent with Grade I diastolic  dysfunction (impaired relaxation).   2. Right ventricular systolic function is normal. The right ventricular  size is normal.   3. Left atrial size was mildly dilated.   4. Mild mitral valve regurgitation.   5. Aortic valve regurgitation is mild to moderate.  __________  Luci Bank patch 09/2019: Normal sinus rhythm with an average heart rate of 71 bpm. 5 episodes of wide-complex tachycardia likely ventricular tachycardia.  The longest lasted 10 beats.  SVT with aberrancy cannot be completely excluded. 8 episodes of supraventricular tachycardia the longest lasted 11 beats. Occasional PVCs with a burden of 2.7%. __________  See CV studies in Epic for more remote imaging    EKG:  EKG is ordered today.  The EKG ordered today demonstrates NSR, 80 bpm, possible prior septal infarct, no acute ST-T changes  Recent Labs: No results found for requested labs within last 365 days.  Recent Lipid Panel No results found for: "CHOL", "TRIG", "HDL", "CHOLHDL", "VLDL", "LDLCALC", "LDLDIRECT"  PHYSICAL EXAM:    VS:  BP 122/66 (BP Location: Left Arm, Patient Position: Sitting, Cuff Size: Normal)   Pulse 80   Ht 5\' 2"  (1.575 m)   Wt 139 lb 6.4 oz (63.2 kg)   SpO2 97%   BMI 25.50 kg/m   BMI: Body mass index is 25.5 kg/m.  Physical Exam Vitals  reviewed.  Constitutional:      Appearance: She is well-developed.  HENT:     Head: Normocephalic and atraumatic.  Eyes:     General:  Right eye: No discharge.        Left eye: No discharge.  Cardiovascular:     Rate and Rhythm: Normal rate and regular rhythm.     Heart sounds: S1 normal and S2 normal. Heart sounds not distant. No midsystolic click and no opening snap. Murmur heard.     Systolic murmur is present with a grade of 1/6 at the upper right sternal border.     No friction rub.     Comments: Rare ectopy. Pulmonary:     Effort: Pulmonary effort is normal. No respiratory distress.     Breath sounds: Normal breath sounds. No decreased breath sounds, wheezing, rhonchi or rales.  Chest:     Chest wall: No tenderness.  Musculoskeletal:     Cervical back: Normal range of motion.  Skin:    General: Skin is warm and dry.     Nails: There is no clubbing.  Neurological:     Mental Status: She is alert and oriented to person, place, and time.  Psychiatric:        Speech: Speech normal.        Behavior: Behavior normal.        Thought Content: Thought content normal.        Judgment: Judgment normal.     Wt Readings from Last 3 Encounters:  12/10/23 139 lb 6.4 oz (63.2 kg)  08/13/23 145 lb 8 oz (66 kg)  04/02/23 145 lb 6.4 oz (66 kg)     ASSESSMENT & PLAN:   Palpitations/PVCs: Schedule 14-day ZIO XT to quantify PVC burden and to evaluate for significant arrhythmia burden.  Based on findings may need to escalate pharmacotherapy and updated labs/imaging.  HTN: Blood pressure is well-controlled in the office today.  Continue current pharmacotherapy.  HLD: Currently on pravastatin.     Disposition: F/u with Dr. Kirke Corin or an APP in 6 weeks.   Medication Adjustments/Labs and Tests Ordered: Current medicines are reviewed at length with the patient today.  Concerns regarding medicines are outlined above. Medication changes, Labs and Tests ordered today are summarized  above and listed in the Patient Instructions accessible in Encounters.   Signed, Eula Listen, [name] 12/10/2023 4:36 PM      [facility] [address] [place], [place] [address] [phone]

## 2023-12-09 NOTE — Telephone Encounter (Signed)
 Patient called to report that she has been experiencing intermittent palpitations over the past 8-9 months. However, she stated that they have become more noticeable recently. She reported that the symptoms are more prominent at rest and during the night. She also noted that, on occasion, the palpitations take her breath away. The patient mentioned seeing her heart rate on her Fitbit rise into the 120s.  An appointment is scheduled for tomorrow, 12/10/23, for further evaluation.

## 2023-12-10 ENCOUNTER — Encounter: Payer: Self-pay | Admitting: Physician Assistant

## 2023-12-10 ENCOUNTER — Ambulatory Visit

## 2023-12-10 ENCOUNTER — Ambulatory Visit: Payer: Self-pay | Attending: Physician Assistant | Admitting: Physician Assistant

## 2023-12-10 VITALS — BP 122/66 | HR 80 | Ht 62.0 in | Wt 139.4 lb

## 2023-12-10 DIAGNOSIS — E785 Hyperlipidemia, unspecified: Secondary | ICD-10-CM | POA: Diagnosis not present

## 2023-12-10 DIAGNOSIS — I493 Ventricular premature depolarization: Secondary | ICD-10-CM

## 2023-12-10 DIAGNOSIS — R002 Palpitations: Secondary | ICD-10-CM

## 2023-12-10 DIAGNOSIS — I10 Essential (primary) hypertension: Secondary | ICD-10-CM

## 2023-12-10 NOTE — Patient Instructions (Signed)
 Medication Instructions:  Your Physician recommend you continue on your current medication as directed.    *If you need a refill on your cardiac medications before your next appointment, please call your pharmacy*  Lab Work: None ordered at this time   Testing/Procedures: ZIO XT- Long Term Monitor Instructions  Your physician has requested you wear a ZIO patch monitor for 14 days.  This is a single patch monitor. [employer] supplies one patch monitor per enrollment. Additional stickers are not available. Please do not apply patch if you will be having a Nuclear Stress Test, Echocardiogram, Cardiac CT, MRI, or Chest Xray during the period you would be wearing the monitor. The patch cannot be worn during these tests. You cannot remove and re-apply the ZIO XT patch monitor.  Your ZIO patch monitor will be mailed 3 day USPS to your address on file. It may take 3-5 days to receive your monitor after you have been enrolled.  Once you have received your monitor, please review the enclosed instructions. Your monitor has already been registered assigning a specific monitor serial # to you.  Billing and Patient Assistance Program Information  We have supplied [employer] with any of your insurance information on file for billing purposes. [employer] offers a sliding scale Patient Assistance Program for patients that do not have insurance, or whose insurance does not completely cover the cost of the ZIO monitor.  You must apply for the Patient Assistance Program to qualify for this discounted rate.  To apply, please call [employer] at [phone], select option 4, select option 2, ask to apply for Patient Assistance Program. Meredeth Ide will ask your household income, and how many people are in your household. They will quote your out-of-pocket cost based on that information.  [employer] will also be able to set up a 62-month, interest-free payment plan if needed.  Applying the monitor   Shave hair from upper left chest.   Hold abrader disc by orange tab. Rub abrader in 40 strokes over the upper left chest as indicated in your monitor instructions.  Clean area with 4 enclosed alcohol pads. Let dry.  Apply patch as indicated in monitor instructions. Patch will be placed under collarbone on left side of chest with arrow pointing upward.  Rub patch adhesive wings for 2 minutes. Remove white label marked "1". Remove the white label marked "2". Rub patch adhesive wings for 2 additional minutes.  While looking in a mirror, press and release button in center of patch. A small green light will flash 3-4 times. This will be your only indicator that the monitor has been turned on.  Do not shower for the first 24 hours. You may shower after the first 24 hours.  Press the button if you feel a symptom. You will hear a small click. Record Date, Time and Symptom in the Patient Logbook.  When you are ready to remove the patch, follow instructions on the last 2 pages of Patient Logbook. Stick patch monitor onto the last page of Patient Logbook.  Place Patient Logbook in the blue and white box. Use locking tab on box and tape box closed securely. The blue and white box has prepaid postage on it. Please place it in the mailbox as soon as possible. Your physician should have your test results approximately 7 days after the monitor has been mailed back to [facility].  Call [facility] Customer Care at [phone] if you have questions regarding your ZIO XT patch monitor. Call them immediately if you see an  orange light blinking on your monitor. If your monitor falls off in less than 4 days, contact our Monitor department at [phone].  If your monitor becomes loose or falls off after 4 days call [employer] at [phone] for suggestions on securing your monitor   Follow-Up: At [facility], you and your health needs are our priority.  As part of our continuing mission to provide you with exceptional heart care, our  providers are all part of one team.  This team includes your primary Cardiologist (physician) and Advanced Practice Providers or APPs (Physician Assistants and Nurse Practitioners) who all work together to provide you with the care you need, when you need it.  Your next appointment:   6 week(s)  Provider:   Eula Listen, [name]

## 2023-12-27 DIAGNOSIS — H401122 Primary open-angle glaucoma, left eye, moderate stage: Secondary | ICD-10-CM | POA: Diagnosis not present

## 2023-12-27 DIAGNOSIS — H401110 Primary open-angle glaucoma, right eye, stage unspecified: Secondary | ICD-10-CM | POA: Diagnosis not present

## 2023-12-27 DIAGNOSIS — H401111 Primary open-angle glaucoma, right eye, mild stage: Secondary | ICD-10-CM | POA: Diagnosis not present

## 2023-12-30 DIAGNOSIS — E538 Deficiency of other specified B group vitamins: Secondary | ICD-10-CM | POA: Diagnosis not present

## 2024-01-02 DIAGNOSIS — R002 Palpitations: Secondary | ICD-10-CM | POA: Diagnosis not present

## 2024-01-03 DIAGNOSIS — R002 Palpitations: Secondary | ICD-10-CM

## 2024-01-06 ENCOUNTER — Other Ambulatory Visit: Payer: Self-pay | Admitting: Emergency Medicine

## 2024-01-06 DIAGNOSIS — Z79899 Other long term (current) drug therapy: Secondary | ICD-10-CM

## 2024-01-06 DIAGNOSIS — I471 Supraventricular tachycardia, unspecified: Secondary | ICD-10-CM

## 2024-01-06 MED ORDER — METOPROLOL SUCCINATE ER 25 MG PO TB24: 25.0000 mg | ORAL_TABLET | Freq: Every day | ORAL | 3 refills | Status: AC

## 2024-01-07 ENCOUNTER — Other Ambulatory Visit: Payer: Self-pay | Admitting: Emergency Medicine

## 2024-01-07 DIAGNOSIS — Z79899 Other long term (current) drug therapy: Secondary | ICD-10-CM

## 2024-01-07 DIAGNOSIS — I471 Supraventricular tachycardia, unspecified: Secondary | ICD-10-CM

## 2024-01-08 LAB — CBC
Hematocrit: 43.1 % (ref 34.0–46.6)
Hemoglobin: 14.3 g/dL (ref 11.1–15.9)
MCH: 29.9 pg (ref 26.6–33.0)
MCHC: 33.2 g/dL (ref 31.5–35.7)
MCV: 90 fL (ref 79–97)
Platelets: 287 10*3/uL (ref 150–450)
RBC: 4.78 x10E6/uL (ref 3.77–5.28)
RDW: 13.1 % (ref 11.7–15.4)
WBC: 5 10*3/uL (ref 3.4–10.8)

## 2024-01-08 LAB — BASIC METABOLIC PANEL WITH GFR
BUN/Creatinine Ratio: 15 (ref 12–28)
BUN: 12 mg/dL (ref 8–27)
CO2: 24 mmol/L (ref 20–29)
Calcium: 9.4 mg/dL (ref 8.7–10.3)
Chloride: 101 mmol/L (ref 96–106)
Creatinine, Ser: 0.79 mg/dL (ref 0.57–1.00)
Glucose: 79 mg/dL (ref 70–99)
Potassium: 4.6 mmol/L (ref 3.5–5.2)
Sodium: 139 mmol/L (ref 134–144)
eGFR: 75 mL/min/{1.73_m2} (ref >59)

## 2024-01-08 LAB — TSH: TSH: 3.53 u[IU]/mL (ref 0.450–4.500)

## 2024-01-08 LAB — MAGNESIUM: Magnesium: 2 mg/dL (ref 1.6–2.3)

## 2024-01-16 DIAGNOSIS — M19071 Primary osteoarthritis, right ankle and foot: Secondary | ICD-10-CM | POA: Diagnosis not present

## 2024-01-24 NOTE — Progress Notes (Unsigned)
 Cardiology Office Note    Date:  01/28/2024   ID:  Gina Ford, DOB 1941/06/04, MRN [id number]  PCP:  Gina Castor, MD  Cardiologist:  Gina Kirks, MD  Electrophysiologist:  None   Chief Complaint: Follow up  History of Present Illness:   Gina Ford is a 82 y.o. female with history of CAD, aortic atherosclerosis, aortic valve insufficiency, PSVT, PVCs, HTN, HLD, large hiatal hernia, and hypothyroidism on replacement therapy who presents for follow up of Zio patch.   She was evaluated in 2012 for atypical chest pain and underwent treadmill MPI that showed no evidence of ischemia with a normal EF.  Zio patch in 2021 showed sinus rhythm with an average rate of 71 bpm.  There were occasional PVCs with an overall burden of 2.7% as well as one 10-beat run of WCT.  In this setting she underwent Lexiscan  MPI that showed no evidence of ischemia with a normal EF.  Echo showed an EF of 60 to 65%, grade 1 diastolic dysfunction, mild mitral regurgitation, and mild to moderate aortic insufficiency.  Prior renal artery duplex has shown no evidence of RAS.  In the summer 2024 she was under increased stress and had some chest pain.  She subsequently underwent Lexiscan  MPI in 05/2023 that showed no evidence of ischemia or scar with an EF greater than 65% and was overall low risk.  Coronary artery calcification and aortic atherosclerosis noted on CT attenuation corrected imaging.  Overall, this was a low risk study.  She was then seen in the office in 12/2023 noting an increase in palpitations with heart rates trending into the 120s bpm.  She remained active at baseline without cardiac limitation.  Subsequent Zio patch showed a predominant rhythm of sinus with an average rate of 70 bpm along with 15 episodes of SVT lasting up to 37.4 seconds with SVT being detected with symptomatic patient events.  In this setting she was started on Toprol -XL 25 mg.  She comes in doing well from a cardiac perspective  and is without symptoms of angina or cardiac decompensation.  She does continue to note some palpitations and is unclear if they are diminished following the addition of metoprolol .  She indicates she mostly ignores them at this point.  No dizziness, presyncope, or syncope.  Continues to swim and walk 01-5999 steps per day without cardiac limitation.  Not checking blood pressure at home currently.   Labs independently reviewed: 01/2024 - magnesium 2.0, TSH normal, BUN 12, serum creatinine 0.79, potassium 4.6, Hgb 14.3, PLT 287 02/2023 - albumin 4.2, AST/ALT normal, A1c 6.3 08/2022 - TC 198, TG 109, HDL 72, LDL 104  Past Medical History:  Diagnosis Date   Abnormal mammogram    NEED R DIAGNO MAMMO 3/12   Achilles tendonitis    B 2ND AND 3RD MTP SYNOVITIS DR. [id number]   Actinic keratosis    HAND 6/11 KELLIE [place], [place]   Allergic rhinitis    Anemia    Arthritis    GERD (gastroesophageal reflux disease)    Glaucoma    HLD (hyperlipidemia)    HTN (hypertension)    Hyperthyroidism    Low serum vitamin D 01/2015   Obesity     Past Surgical History:  Procedure Laterality Date   COLONOSCOPY     ESOPHAGOGASTRODUODENOSCOPY  2012, 2016   INGUINAL HERNIA REPAIR Left    KNEE ARTHROSCOPY     LOBECTOMY     Right Thyroid , then Left   THYROIDECTOMY  1991   TUBAL LIGATION     VARICOSE VEIN SURGERY      Current Medications: Current Meds  Medication Sig   amLODipine  (NORVASC ) 5 MG tablet Take 1 tablet (5 mg total) by mouth daily.   cyanocobalamin (VITAMIN B12) 1000 MCG/ML injection Inject 1,000 mcg into the muscle every 30 (thirty) days.   hydrocortisone  (ANUSOL -HC) 2.5 % rectal cream Place 1 application  rectally 2 (two) times daily as needed for hemorrhoids or itching.   hydrocortisone  2.5 % cream Apply twice daily to affected areas groin until improved.   ketoconazole  (NIZORAL ) 2 % shampoo MASSAGE INTO SCALP, LET SIT SEVERAL MINUTES BEFORE RINSING.   latanoprost (XALATAN) 0.005 %  ophthalmic solution Place 1 drop into both eyes at bedtime.   levothyroxine (SYNTHROID, LEVOTHROID) 100 MCG tablet Take 100 mcg by mouth daily before breakfast.   metoprolol  succinate (TOPROL  XL) 25 MG 24 hr tablet Take 1 tablet (25 mg total) by mouth daily.   pravastatin (PRAVACHOL) 40 MG tablet Take 40 mg by mouth daily.   TIMOLOL HEMIHYDRATE OP Apply 1 drop to eye daily.   valsartan -hydrochlorothiazide  (DIOVAN -HCT) 320-12.5 MG tablet Take 1 tablet by mouth daily.   Current Facility-Administered Medications for the 01/28/24 encounter (Office Visit) with Roark Chick, [name]  Medication   ketoconazole  (NIZORAL ) 2 % shampoo    Allergies:   Doxazosin, Doxazosin mesylate, and Univasc [moexipril]   Social History   Socioeconomic History   Marital status: Married    Spouse name: Not on file   Number of children: 1   Years of education: Not on file   Highest education level: Not on file  Occupational History   Occupation: RETIRED  Tobacco Use   Smoking status: Never   Smokeless tobacco: Never  Vaping Use   Vaping status: Never Used  Substance and Sexual Activity   Alcohol use: Yes    Comment: glass of wine rarely   Drug use: No   Sexual activity: Not on file  Other Topics Concern   Not on file  Social History Narrative   Not on file   Social Drivers of Health   Financial Resource Strain: Low Risk  (01/16/2024)   Received from [facility]   Overall Financial Resource Strain (CARDIA)    Difficulty of Paying Living Expenses: Not hard at all  Food Insecurity: No Food Insecurity (01/16/2024)   Received from [facility]   Hunger Vital Sign    Worried About Running Out of Food in the Last Year: Never true    Ran Out of Food in the Last Year: Never true  Transportation Needs: No Transportation Needs (01/16/2024)   Received from [facility] - Transportation    In the past 12 months, has lack of transportation kept you  from medical appointments or from getting medications?: No    Lack of Transportation (Non-Medical): No  Physical Activity: Not on file  Stress: Not on file  Social Connections: Not on file     Family History:  The patient's family history includes Arthritis in her father and mother; CVA in her father and mother; Colon polyps in her mother; Heart attack in an other family member; Heart attack (age of onset: 71) in her father; Hypertension in her mother; Leukemia (age of onset: 54) in her sister; Melanoma in her sister; Stroke in her father; Stroke (age of onset: 8) in her mother.  ROS:   12-point review of systems is negative  unless otherwise noted in the HPI.   EKGs/Labs/Other Studies Reviewed:    Studies reviewed were summarized above. The additional studies were reviewed today:  Zio patch 12/2023: Patient had a min HR of 48 bpm, max HR of 179 bpm, and avg HR of 70 bpm. Predominant underlying rhythm was Sinus Rhythm. Intermittent Bundle Branch Block was present. 15 Supraventricular Tachycardia runs occurred, the run with the fastest interval lasting 8.9 secs with a max rate of 179 bpm, the longest lasting 37.4 secs with an avg rate of 120 bpm. Supraventricular Tachycardia was detected within +/- 45 seconds of symptomatic patient event(s). Rare PACs and rare PVCs. __________  Lexiscan  MPI 05/14/2023:   Normal pharmacologic myocardial perfusion stress test without evidence of significant ischemia or scar.   Left ventricular systolic function is normal (LVEF greater than 65%).   No significant coronary artery calcification is identified.  Aortic atherosclerosis is noted.   Large hiatal hernia is present, similar to the CT abdomen and pelvis from 08/13/2022.   No significant change noted compared to prior study from 11/02/2019.   This is a low risk study. __________   Renal artery ultrasound/12/2020: Summary:  Largest Aortic Diameter: 2.1 cm    Renal:    Right: Normal size right  kidney. Abnormal right Resistive Index.         Normal cortical thickness of right kidney. Cyst(s) noted. RRV         flow present. 1-59% stenosis of the right renal artery.  Left:  Normal size of left kidney. Abnormal left Resisitve Index.         Normal cortical thickness of the left kidney. LRV flow         present. 1-59% stenosis of the left renal artery.  Mesenteric:  Normal Celiac artery and Superior Mesenteric artery findings.  __________   Lexiscan  MPI 11/02/2019:   There was no ST segment deviation noted during stress. No T wave inversion was noted during stress. The study is normal. This is a low risk study. The left ventricular ejection fraction is normal (55-65%). __________   2D echo 10/15/2019: 1. Left ventricular ejection fraction, by estimation, is 60 to 65%. The  left ventricle has normal function. The left ventricle has no regional  wall motion abnormalities. Left ventricular diastolic parameters are  consistent with Grade I diastolic  dysfunction (impaired relaxation).   2. Right ventricular systolic function is normal. The right ventricular  size is normal.   3. Left atrial size was mildly dilated.   4. Mild mitral valve regurgitation.   5. Aortic valve regurgitation is mild to moderate.  __________   Zio patch 09/2019: Normal sinus rhythm with an average heart rate of 71 bpm. 5 episodes of wide-complex tachycardia likely ventricular tachycardia.  The longest lasted 10 beats.  SVT with aberrancy cannot be completely excluded. 8 episodes of supraventricular tachycardia the longest lasted 11 beats. Occasional PVCs with a burden of 2.7%. __________   See CV studies in Epic for more remote imaging   EKG:  EKG is not ordered today.    Recent Labs: 01/07/2024: BUN 12; Creatinine, Ser 0.79; Hemoglobin 14.3; Magnesium 2.0; Platelets 287; Potassium 4.6; Sodium 139; TSH 3.530  Recent Lipid Panel No results found for: "CHOL", "TRIG", "HDL", "CHOLHDL", "VLDL",  "LDLCALC", "LDLDIRECT"  PHYSICAL EXAM:    VS:  BP (!) 168/80   Pulse (!) 56   Ht 5\' 2"  (1.575 m)   Wt 139 lb 3.2 oz (63.1 kg)   SpO2 98%  BMI 25.46 kg/m   BMI: Body mass index is 25.46 kg/m.  Physical Exam Vitals reviewed.  Constitutional:      Appearance: She is well-developed.  HENT:     Head: Normocephalic and atraumatic.  Eyes:     General:        Right eye: No discharge.        Left eye: No discharge.  Cardiovascular:     Rate and Rhythm: Normal rate and regular rhythm.     Heart sounds: Normal heart sounds, S1 normal and S2 normal. Heart sounds not distant. No midsystolic click and no opening snap. No murmur heard.    No friction rub.  Pulmonary:     Effort: Pulmonary effort is normal. No respiratory distress.     Breath sounds: Normal breath sounds. No decreased breath sounds, wheezing, rhonchi or rales.  Chest:     Chest wall: No tenderness.  Musculoskeletal:     Cervical back: Normal range of motion.     Right lower leg: No edema.     Left lower leg: No edema.  Skin:    General: Skin is warm and dry.     Nails: There is no clubbing.  Neurological:     Mental Status: She is alert and oriented to person, place, and time.  Psychiatric:        Speech: Speech normal.        Behavior: Behavior normal.        Thought Content: Thought content normal.        Judgment: Judgment normal.     Wt Readings from Last 3 Encounters:  01/28/24 139 lb 3.2 oz (63.1 kg)  12/10/23 139 lb 6.4 oz (63.2 kg)  08/13/23 145 lb 8 oz (66 kg)     ASSESSMENT & PLAN:   Palpitations/PSVT/PVCs: Symptoms overall appear stable following addition of Toprol -XL 25 mg daily.  Heart rate precludes further titration of beta-blocker.  Aortic insufficiency: Mild to moderate by echo in 2021.  Anticipate follow-up echo at next visit.  HTN: Blood pressure is elevated at triage at 171/79 with repeat blood pressure 168/80.  Amlodipine  was discontinued following the initiation of Toprol  in an  effort to avoid hypotension.  Add back amlodipine  5 mg with continuation of Toprol -XL 25 mg.  HLD: LDL 104 in 08/2022.  Currently on pravastatin.  Has follow-up labs with PCP later this week.    Disposition: F/u with Dr. Alvenia Aus or an APP in 3 months.   Medication Adjustments/Labs and Tests Ordered: Current medicines are reviewed at length with the patient today.  Concerns regarding medicines are outlined above. Medication changes, Labs and Tests ordered today are summarized above and listed in the Patient Instructions accessible in Encounters.   Signed, Varney Gentleman, [name] 01/28/2024 4:42 PM     [facility] [place] [address] [place], [place] [address] [phone]

## 2024-01-28 ENCOUNTER — Ambulatory Visit: Attending: Physician Assistant | Admitting: Physician Assistant

## 2024-01-28 ENCOUNTER — Encounter: Payer: Self-pay | Admitting: Physician Assistant

## 2024-01-28 VITALS — BP 168/80 | HR 56 | Ht 62.0 in | Wt 139.2 lb

## 2024-01-28 DIAGNOSIS — E785 Hyperlipidemia, unspecified: Secondary | ICD-10-CM | POA: Diagnosis not present

## 2024-01-28 DIAGNOSIS — I493 Ventricular premature depolarization: Secondary | ICD-10-CM

## 2024-01-28 DIAGNOSIS — I351 Nonrheumatic aortic (valve) insufficiency: Secondary | ICD-10-CM | POA: Diagnosis not present

## 2024-01-28 DIAGNOSIS — I10 Essential (primary) hypertension: Secondary | ICD-10-CM | POA: Diagnosis not present

## 2024-01-28 DIAGNOSIS — R002 Palpitations: Secondary | ICD-10-CM

## 2024-01-28 DIAGNOSIS — I471 Supraventricular tachycardia, unspecified: Secondary | ICD-10-CM

## 2024-01-28 MED ORDER — AMLODIPINE BESYLATE 5 MG PO TABS
5.0000 mg | ORAL_TABLET | Freq: Every day | ORAL | 3 refills | Status: DC
Start: 2024-01-28 — End: 2024-05-07

## 2024-01-28 NOTE — Patient Instructions (Signed)
 Medication Instructions:  Your physician recommends the following medication changes.  START TAKING: Amlodipine  5 mg by mouth daily  *If you need a refill on your cardiac medications before your next appointment, please call your pharmacy*  Lab Work: No labs ordered today   Testing/Procedures: No test ordered today   Follow-Up: At [facility], you and your health needs are our priority.  As part of our continuing mission to provide you with exceptional heart care, our providers are all part of one team.  This team includes your primary Cardiologist (physician) and Advanced Practice Providers or APPs (Physician Assistants and Nurse Practitioners) who all work together to provide you with the care you need, when you need it.  Your next appointment:   3 month(s)  Provider:   Varney Gentleman, [name]

## 2024-01-30 DIAGNOSIS — I10 Essential (primary) hypertension: Secondary | ICD-10-CM | POA: Diagnosis not present

## 2024-01-30 DIAGNOSIS — R7303 Prediabetes: Secondary | ICD-10-CM | POA: Diagnosis not present

## 2024-01-30 DIAGNOSIS — E039 Hypothyroidism, unspecified: Secondary | ICD-10-CM | POA: Diagnosis not present

## 2024-01-30 DIAGNOSIS — E538 Deficiency of other specified B group vitamins: Secondary | ICD-10-CM | POA: Diagnosis not present

## 2024-02-06 DIAGNOSIS — Z1331 Encounter for screening for depression: Secondary | ICD-10-CM | POA: Diagnosis not present

## 2024-02-06 DIAGNOSIS — Z0000 Encounter for general adult medical examination without abnormal findings: Secondary | ICD-10-CM | POA: Diagnosis not present

## 2024-02-06 DIAGNOSIS — M24542 Contracture, left hand: Secondary | ICD-10-CM | POA: Diagnosis not present

## 2024-02-06 DIAGNOSIS — I10 Essential (primary) hypertension: Secondary | ICD-10-CM | POA: Diagnosis not present

## 2024-02-06 DIAGNOSIS — E039 Hypothyroidism, unspecified: Secondary | ICD-10-CM | POA: Diagnosis not present

## 2024-02-06 DIAGNOSIS — K4021 Bilateral inguinal hernia, without obstruction or gangrene, recurrent: Secondary | ICD-10-CM | POA: Diagnosis not present

## 2024-02-06 DIAGNOSIS — I471 Supraventricular tachycardia, unspecified: Secondary | ICD-10-CM | POA: Diagnosis not present

## 2024-02-06 DIAGNOSIS — K449 Diaphragmatic hernia without obstruction or gangrene: Secondary | ICD-10-CM | POA: Diagnosis not present

## 2024-02-06 DIAGNOSIS — R7303 Prediabetes: Secondary | ICD-10-CM | POA: Diagnosis not present

## 2024-02-17 ENCOUNTER — Ambulatory Visit: Payer: Self-pay | Admitting: Surgery

## 2024-02-17 DIAGNOSIS — K4021 Bilateral inguinal hernia, without obstruction or gangrene, recurrent: Secondary | ICD-10-CM | POA: Diagnosis not present

## 2024-02-17 NOTE — H&P (Signed)
 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   History of Present Illness Gina Ford is an 83 year old female who presents with bilateral inguinal hernias.  She has previously undergone surgical fixation with mesh for one of the hernias but cannot recall which side was treated. Over the past three months, she has experienced swelling and protrusion on the right side. She has random episodes of pain in the right inguinal area, with a maximum intensity of 3 out of 10, lasting a few minutes. The pain is not associated with specific activities. A CT scan of the abdomen and pelvis with contrast on August 13, 2022, showed no issues in the inguinal region. The left side also shows swelling, but it is less pronounced than the right.  Surgery done 30-34yrs ago   Past Medical History:  has a past medical history of Achilles tendonitis, Actinic keratosis, Allergic rhinitis, Anemia, Arthritis, GERD (gastroesophageal reflux disease), Glaucoma, Hypercholesterolemia, Hypertension, Hypothyroidism, Osteopenia, Palmar fascial fibromatosis (dupuytren), and Scoliosis.  Past Surgical History:  Past Surgical History:  Procedure Laterality Date   thyroid  nodule removed  1991   2nd thyroid  surgery for thyroidectomy     COLONOSCOPY     EGD     HERNIA REPAIR     KNEE ARTHROSCOPY     TUBAL LIGATION      Family History: family history includes Arthritis in her father and mother; Heart disease in her sister and sister; Stroke in her father and mother.  Social History:  reports that she has never smoked. She has never used smokeless tobacco. She reports current alcohol use of about 5.0 standard drinks of alcohol per week. She reports that she does not use drugs.  Current Medications: has a current medication list which includes the following prescription(s): acetaminophen, amlodipine ,  cyanocobalamin, hydrocortisone , hydroxyzine hcl, ketoconazole , latanoprost, levothyroxine, metoprolol  succinate, pravastatin, timolol hemihydrate, timolol maleate, and valsartan -hydrochlorothiazide , and the following Facility-Administered Medications: cyanocobalamin.  Allergies:  Allergies as of 02/17/2024 - Reviewed 02/17/2024  Allergen Reaction Noted   Doxazosin Itching 09/19/2010   Univasc [moexipril] Itching 12/17/2019    ROS:  A 15 point review of systems was performed and pertinent positives and negatives noted in HPI   Objective:     BP 116/64   Pulse 58   Ht 157.5 cm (5' 2)   Wt 60.8 kg (134 lb)   BMI 24.51 kg/m   Constitutional :  Alert, cooperative, no distress  Lymphatics/Throat:  Supple, no lymphadenopathy  Respiratory:  clear to auscultation bilaterally  Cardiovascular:  regular rate and rhythm  Gastrointestinal: soft, non-tender; bowel sounds normal; no masses,  no organomegaly. inguinal hernia noted.  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 The risks of general anesthetic, if  used, includes MI, CVA, sudden death or even reaction to anesthetic medications also discussed. Alternatives include continued observation.  Benefits include possible symptom relief, prevention of incarceration,  strangulation, enlargement in size over time, and the risk of emergency surgery in the face of strangulation.   Typical post-op recovery time of 3-5 days with 2 weeks of activity restrictions were also discussed.  ED return precautions given for sudden increase in pain, size of hernia with accompanying fever, nausea, and/or vomiting.  The patient verbalized understanding and all questions were answered to the patient's satisfaction.   2. Patient has elected to proceed with surgical treatment. Procedure will be scheduled. bilateral, robotic assisted laparoscopic  labs/images/medications/previous chart entries reviewed personally and relevant changes/updates noted above.

## 2024-03-02 DIAGNOSIS — E538 Deficiency of other specified B group vitamins: Secondary | ICD-10-CM | POA: Diagnosis not present

## 2024-03-10 DIAGNOSIS — M720 Palmar fascial fibromatosis [Dupuytren]: Secondary | ICD-10-CM | POA: Diagnosis not present

## 2024-03-27 DIAGNOSIS — M720 Palmar fascial fibromatosis [Dupuytren]: Secondary | ICD-10-CM | POA: Diagnosis not present

## 2024-04-02 DIAGNOSIS — E538 Deficiency of other specified B group vitamins: Secondary | ICD-10-CM | POA: Diagnosis not present

## 2024-04-08 ENCOUNTER — Other Ambulatory Visit: Payer: Self-pay

## 2024-04-08 ENCOUNTER — Encounter
Admission: RE | Admit: 2024-04-08 | Discharge: 2024-04-08 | Disposition: A | Discharge status: Home / Self Care | Source: Ambulatory Visit | Attending: Surgery | Admitting: Surgery

## 2024-04-08 ENCOUNTER — Telehealth: Payer: Self-pay

## 2024-04-08 HISTORY — DX: Scoliosis, unspecified: M41.9

## 2024-04-08 HISTORY — DX: Supraventricular tachycardia, unspecified: I47.10

## 2024-04-08 HISTORY — DX: Nonrheumatic aortic (valve) insufficiency: I35.1

## 2024-04-08 HISTORY — DX: Bilateral inguinal hernia, without obstruction or gangrene, recurrent: K40.21

## 2024-04-08 HISTORY — DX: Atherosclerotic heart disease of native coronary artery without angina pectoris: I25.10

## 2024-04-08 HISTORY — DX: Cardiac arrhythmia, unspecified: I49.9

## 2024-04-08 HISTORY — DX: Deficiency of other specified B group vitamins: E53.8

## 2024-04-08 HISTORY — DX: Prediabetes: R73.03

## 2024-04-08 HISTORY — DX: Atherosclerosis of aorta: I70.0

## 2024-04-08 HISTORY — DX: Personal history of other diseases of the digestive system: Z87.19

## 2024-04-08 HISTORY — DX: Other specified postprocedural states: Z98.890

## 2024-04-08 NOTE — Patient Instructions (Addendum)
 Your procedure is scheduled on: 04/16/24 - Thursday Report to the Registration Desk on the 1st floor of the [facility]. To find out your arrival time, please call [phone] between 1PM - 3PM on: 04/15/24 - Wednesday If your arrival time is 6:00 am, do not arrive before that time as the Medical Mall entrance doors do not open until 6:00 am.  REMEMBER: Instructions that are not followed completely may result in serious medical risk, up to and including death; or upon the discretion of your surgeon and anesthesiologist your surgery may need to be rescheduled.  Do not eat food after midnight the night before surgery.  No gum chewing or hard candies.  You may however, drink CLEAR liquids up to 2 hours before you are scheduled to arrive for your surgery. Do not drink anything within 2 hours of your scheduled arrival time.  Clear liquids include: - water  - apple juice without pulp - gatorade (not RED colors) - black coffee or tea (Do NOT add milk or creamers to the coffee or tea) Do NOT drink anything that is not on this list.  One week prior to surgery: Stop Anti-inflammatories (NSAIDS) such as Advil, Aleve, Ibuprofen, Motrin, Naproxen, Naprosyn and Aspirin based products such as Excedrin, Goody's Powder, BC Powder. You may take Tylenol if needed for pain up until the day of surgery.  Stop ANY OVER THE COUNTER supplements until after surgery.  Continue taking all of your other prescription medications up until the day before surgery.  ON THE DAY OF SURGERY ONLY TAKE THESE MEDICATIONS WITH SIPS OF WATER:  amLODipine  (NORVASC )  levothyroxine (SYNTHROID ) metoprolol  succinate  timolol (TIMOPTIC)    No Alcohol for 24 hours before or after surgery.  No Smoking including e-cigarettes for 24 hours before surgery.  No chewable tobacco products for at least 6 hours before surgery.  No nicotine patches on the day of surgery.  Do not use any recreational drugs for at least a week  (preferably 2 weeks) before your surgery.  Please be advised that the combination of cocaine and anesthesia may have negative outcomes, up to and including death. If you test positive for cocaine, your surgery will be cancelled.  On the morning of surgery brush your teeth with toothpaste and water, you may rinse your mouth with mouthwash if you wish. Do not swallow any toothpaste or mouthwash.  Do not wear jewelry, make-up, hairpins, clips or nail polish.  For welded (permanent) jewelry: bracelets, anklets, waist bands, etc.  Please have this removed prior to surgery.  If it is not removed, there is a chance that hospital personnel will need to cut it off on the day of surgery.  Do not wear lotions, powders, or perfumes.   Do not shave body hair from the neck down 48 hours before surgery.  Contact lenses, hearing aids and dentures may not be worn into surgery.  Do not bring valuables to the hospital. [facility] is not responsible for any missing/lost belongings or valuables.   Notify your doctor if there is any change in your medical condition (cold, fever, infection).  Wear comfortable clothing (specific to your surgery type) to the hospital.  After surgery, you can help prevent lung complications by doing breathing exercises.  Take deep breaths and cough every 1-2 hours. Your doctor may order a device called an Incentive Spirometer to help you take deep breaths.  When coughing or sneezing, hold a pillow firmly against your incision with both hands. This is  called "splinting." Doing this helps protect your incision. It also decreases belly discomfort.  If you are being admitted to the hospital overnight, leave your suitcase in the car. After surgery it may be brought to your room.  In case of increased patient census, it may be necessary for you, the patient, to continue your postoperative care in the Same Day Surgery department.  If you are being discharged the day of surgery, you  will not be allowed to drive home. You will need a responsible individual to drive you home and stay with you for 24 hours after surgery.   If you are taking public transportation, you will need to have a responsible individual with you.  Please call the Pre-admissions Testing Dept. at [phone] if you have any questions about these instructions.  Surgery Visitation Policy:  Patients having surgery or a procedure may have two visitors.  Children under the age of 43 must have an adult with them who is not the patient.  Inpatient Visitation:    Visiting hours are 7 a.m. to 8 p.m. Up to four visitors are allowed at one time in a patient room. The visitors may rotate out with other people during the day.  One visitor age 56 or older may stay with the patient overnight and must be in the room by 8 p.m.   Merchandiser, retail to address health-related social needs:  [email or web address]

## 2024-04-08 NOTE — Telephone Encounter (Signed)
-----   Message from Dorise CHARLENA Pereyra sent at 04/08/2024 11:17 AM EDT ----- Regarding: Request for pre-operative cardiac clearance Request for pre-operative cardiac clearance:  1. What type of surgery is being performed?  REPAIR, HERNIA, INGUINAL, ROBOT-ASSISTED, LAPAROSCOPIC, USING MESH  2. When is this surgery scheduled?  04/16/2024  3. Type of clearance being requested (medical, pharmacy, both)? MEDICAL   4. Are there any medications that need to be held prior to surgery? NONE  5. Practice name and name of physician performing surgery?  Performing surgeon: Dr. [name], DO Requesting clearance: Dorise Pereyra, FNP-C    6. Anesthesia type (none, local, MAC, general)? GENERAL  7. What is the office phone and fax number?   Phone: [phone] Fax: [phone]  ATTENTION: Unable to create telephone message as per your standard workflow. Directed by [facility] providers to send requests for cardiac clearance to this pool for appropriate distribution to provider covering pre-operative clearances.   Dorise Pereyra, MSN, APRN, FNP-C, CEN [facility]  Peri-operative Services Nurse Practitioner Phone: [phone] 04/08/24 11:17 AM

## 2024-04-08 NOTE — Telephone Encounter (Signed)
 Spoke with patient who is agreeable to do a tele visit on 8/11 at 10:40 am. Med rec and consent have been done.

## 2024-04-08 NOTE — Telephone Encounter (Signed)
   Name: Gina Ford  DOB: 08-Feb-1941  MRN: [id number]  Primary Cardiologist: [name], MD   Preoperative team, please contact this patient and set up a phone call appointment for further preoperative risk assessment. Please obtain consent and complete medication review. Last seen by Bernardino Bring, PA on 01/28/2024, Thank you for your help.  I confirm that guidance regarding antiplatelet and oral anticoagulation therapy has been completed and, if necessary, noted below.\  Patient is not on anticoagulation or antiplatelet per review of medical record in Epic.    I also confirmed the patient resides in the state of [place] . As per [facility] Medical Board telemedicine laws, the patient must reside in the state in which the provider is licensed.   Lamarr Satterfield, NP 04/08/2024, 11:44 AM [facility]

## 2024-04-08 NOTE — Telephone Encounter (Signed)
  Patient Consent for Virtual Visit        Gina Ford has provided verbal consent on 04/08/2024 for a virtual visit (video or telephone).   CONSENT FOR VIRTUAL VISIT FOR:  Gina Ford  By participating in this virtual visit I agree to the following:  I hereby voluntarily request, consent and authorize [facility] and its employed or contracted physicians, physician assistants, nurse practitioners or other licensed health care professionals (the Practitioner), to provide me with telemedicine health care services (the "Services) as deemed necessary by the treating Practitioner. I acknowledge and consent to receive the Services by the Practitioner via telemedicine. I understand that the telemedicine visit will involve communicating with the Practitioner through live audiovisual communication technology and the disclosure of certain medical information by electronic transmission. I acknowledge that I have been given the opportunity to request an in-person assessment or other available alternative prior to the telemedicine visit and am voluntarily participating in the telemedicine visit.  I understand that I have the right to withhold or withdraw my consent to the use of telemedicine in the course of my care at any time, without affecting my right to future care or treatment, and that the Practitioner or I may terminate the telemedicine visit at any time. I understand that I have the right to inspect all information obtained and/or recorded in the course of the telemedicine visit and may receive copies of available information for a reasonable fee.  I understand that some of the potential risks of receiving the Services via telemedicine include:  Delay or interruption in medical evaluation due to technological equipment failure or disruption; Information transmitted may not be sufficient (e.g. poor resolution of images) to allow for appropriate medical decision making by the Practitioner;  and/or  In rare instances, security protocols could fail, causing a breach of personal health information.  Furthermore, I acknowledge that it is my responsibility to provide information about my medical history, conditions and care that is complete and accurate to the best of my ability. I acknowledge that Practitioner's advice, recommendations, and/or decision may be based on factors not within their control, such as incomplete or inaccurate data provided by me or distortions of diagnostic images or specimens that may result from electronic transmissions. I understand that the practice of medicine is not an exact science and that Practitioner makes no warranties or guarantees regarding treatment outcomes. I acknowledge that a copy of this consent can be made available to me via my patient portal [facility] MyChart), or I can request a printed copy by calling the office of  [facility].    I understand that my insurance will be billed for this visit.   I have read or had this consent read to me. I understand the contents of this consent, which adequately explains the benefits and risks of the Services being provided via telemedicine.  I have been provided ample opportunity to ask questions regarding this consent and the Services and have had my questions answered to my satisfaction. I give my informed consent for the services to be provided through the use of telemedicine in my medical care

## 2024-04-08 NOTE — Telephone Encounter (Signed)
   Pre-operative Risk Assessment    Patient Name: Gina Ford  DOB: [birth year] MRN: [id number]   Date of last office visit: 01/28/24 RYAN DUNN, [name] Date of next office visit: 05/07/24 RYAN DUNN, [name]   Request for Surgical Clearance    Procedure:  REPAIR, HERNIA, INGUINAL, ROBOT-ASSISTED, LAPAROSCOPIC, USING MESH  Date of Surgery:  Clearance 04/16/24                                Surgeon:  DR ISAMI SAKAI, DO Surgeon's Group or Practice Name:  [facility] Phone number:  [phone] Fax number:  [phone]   Type of Clearance Requested:   - Medical    Type of Anesthesia:  General    Additional requests/questions:    Signed, Lucie DELENA Ku   04/08/2024, 11:29 AM     Elnor Dorise BRAVO, NP  P Cv Div Preop Callback Request for pre-operative cardiac clearance:   1. What type of surgery is being performed? REPAIR, HERNIA, INGUINAL, ROBOT-ASSISTED, LAPAROSCOPIC, USING MESH  2. When is this surgery scheduled? 04/16/2024   3. Type of clearance being requested (medical, pharmacy, both)? MEDICAL   4. Are there any medications that need to be held prior to surgery? NONE  5. Practice name and name of physician performing surgery? Performing surgeon: Dr. [name], DO Requesting clearance: Dorise Elnor, FNP-C     6. Anesthesia type (none, local, MAC, general)? GENERAL  7. What is the office phone and fax number?   Phone: [phone] Fax: [phone]  ATTENTION: Unable to create telephone message as per your standard workflow. Directed by [facility] providers to send requests for cardiac clearance to this pool for appropriate distribution to provider covering pre-operative clearances.  Dorise Elnor, MSN, APRN, FNP-C, CEN [facility] Peri-operative Services Nurse Practitioner Phone: [phone] 04/08/24 11:17 AM

## 2024-04-13 ENCOUNTER — Ambulatory Visit: Attending: Internal Medicine

## 2024-04-13 DIAGNOSIS — Z01810 Encounter for preprocedural cardiovascular examination: Secondary | ICD-10-CM | POA: Diagnosis not present

## 2024-04-13 NOTE — Progress Notes (Signed)
 Virtual Visit via Telephone Note   Because of Gina Ford co-morbid illnesses, she is at least at moderate risk for complications without adequate follow up.  This format is felt to be most appropriate for this patient at this time.  Due to technical limitations with video connection (technology), today's appointment will be conducted as an audio only telehealth visit, and Gina Ford verbally agreed to proceed in this manner.   All issues noted in this document were discussed and addressed.  No physical exam could be performed with this format.  Evaluation Performed:  Preoperative cardiovascular risk assessment _____________   Date:  04/13/2024   Patient ID:  Gina Ford, DOB 06-13-1941, MRN [id number] Patient Location:  Home Provider location:   Office  Primary Care Provider:  [name], Ford Primary Cardiologist:  Gina Cage, Ford  Chief Complaint / Patient Profile  83 y.o. y/o female with a h/o CAD noted on CT attenuation corrected imaging, aortic atherosclerosis, aortic valve insufficiency, PSVT, PVCs, hypertension, hyperlipidemia, hiatal hernia and hypothyroidism who is pending laparoscopic inguinal hernia repair, robot-assisted using mesh with Dr. Henriette Ford and presents today for telephonic preoperative cardiovascular risk assessment. History of Present Illness  Gina Ford is a 83 y.o. female who presents via audio/video conferencing for a telehealth visit today.  Pt was last seen in cardiology clinic on 01/28/2024 by Gina Bring, PA.  At that time Gina Ford was doing well, she noted some ongoing palpitations, noted that they were overall bothersome and she was ignoring them.  She was continuing to swim and walk [phone] steps per day without cardiac limitation.  The patient is now pending procedure as outlined above. Since her last visit, she has remained stable from cardiac standpoint, she notes occasional palpitations that are fleeting and not bothersome to  her..  Today she denies chest pain, shortness of breath, lower extremity edema, fatigue, melena, hematuria, hemoptysis, diaphoresis, weakness, presyncope, syncope, orthopnea, and PND.  She is able to achieve 4 METS of activity, she regularly walks 2 miles a day and attends water aerobics classes. Past Medical History    Past Medical History:  Diagnosis Date   Abnormal mammogram    NEED R DIAGNO MAMMO 3/12   Achilles tendonitis    B 2ND AND 3RD MTP SYNOVITIS Gina Ford   Actinic keratosis    HAND 6/11 Gina SHEFFIELD, PA   Allergic rhinitis    Anemia    Aortic atherosclerosis (HCC)    Aortic valve insufficiency, etiology of cardiac valve disease unspecified    Arthritis    B12 deficiency    Bilateral recurrent inguinal hernia without obstruction or gangrene    Coronary artery disease    Dysrhythmia    GERD (gastroesophageal reflux disease)    Glaucoma    History of hiatal hernia    HLD (hyperlipidemia)    HTN (hypertension)    Hyperthyroidism    Low serum vitamin D 01/2015   Obesity    PONV (postoperative nausea and vomiting)    Pre-diabetes    Scoliosis    SVT (supraventricular tachycardia) (HCC)    Past Surgical History:  Procedure Laterality Date   COLONOSCOPY     ESOPHAGOGASTRODUODENOSCOPY  2012, 2016   INGUINAL HERNIA REPAIR Left    KNEE ARTHROSCOPY Right    LOBECTOMY     Right Thyroid , then Left   THYROIDECTOMY  1991   TUBAL LIGATION     VARICOSE VEIN SURGERY      Allergies  Allergies  Allergen Reactions   Doxazosin Itching and Rash   Univasc [Moexipril] Itching and Rash    Home Medications    Prior to Admission medications   Medication Sig Start Date End Date Taking? Authorizing Provider  amLODipine  (NORVASC ) 5 MG tablet Take 1 tablet (5 mg total) by mouth daily. 01/28/24 04/27/24  Gina Gina HERO, [name]  betamethasone  dipropionate 0.05 % lotion Apply topically as directed. Every day to aa scalp until clear, then prn flares, avoid face, groin, axilla  03/05/23   Gina Sawyer, Ford  celecoxib  (CELEBREX ) 200 MG capsule Take 200 mg by mouth daily.    Provider, Historical, Ford  cyanocobalamin  (VITAMIN B12) 1000 MCG/ML injection Inject 1,000 mcg into the muscle every 30 (thirty) days.    Provider, Historical, Ford  hydrocortisone  2.5 % cream Apply twice daily to affected areas groin until improved. Patient taking differently: Apply 1 Application topically daily as needed (hemorrhoids). 04/09/23   Gina Sawyer, Ford  ketoconazole  (NIZORAL ) 2 % shampoo MASSAGE INTO SCALP, LET SIT SEVERAL MINUTES BEFORE RINSING. Patient taking differently: Apply 1 Application topically daily as needed (psoriasis). 10/28/23   Gina Ford  latanoprost (XALATAN) 0.005 % ophthalmic solution Place 1 drop into both eyes at bedtime.    Provider, Historical, Ford  levothyroxine (SYNTHROID, LEVOTHROID) 100 MCG tablet Take 100 mcg by mouth daily before breakfast.    Provider, Historical, Ford  metoprolol  succinate (TOPROL  XL) 25 MG 24 hr tablet Take 1 tablet (25 mg total) by mouth daily. 01/06/24   Gina Gina HERO, [name]  pimecrolimus  (ELIDEL ) 1 % cream Apply topically as directed. Every day to itchy scaly rash on face until clear, then as needed for flares 03/05/23   Gina Ford  pravastatin (PRAVACHOL) 40 MG tablet Take 40 mg by mouth daily.    Provider, Historical, Ford  timolol (TIMOPTIC) 0.5 % ophthalmic solution Place 1 drop into both eyes in the morning. 02/28/24   Provider, Historical, Ford  trolamine salicylate (ASPERCREME) 10 % cream Apply 1 Application topically at bedtime.    Provider, Historical, Ford  valsartan -hydrochlorothiazide  (DIOVAN -HCT) 320-12.5 MG tablet Take 1 tablet by mouth daily. 01/16/23   Provider, Historical, Ford  Vitamin D, Ergocalciferol, (DRISDOL) 50000 units CAPS capsule Take 50,000 Units by mouth every 7 (seven) days.    Provider, Historical, Ford    Physical Exam  Vital Signs:  Gina Ford does not have vital signs available for review today. Given telephonic  nature of communication, physical exam is limited. AAOx3. NAD. Normal affect.  Speech and respirations are unlabored. Accessory Clinical Findings  None Assessment & Plan    1.  Preoperative Cardiovascular Risk Assessment: Ms. Barley perioperative risk of a major cardiac event is 0.4% according to the Revised Cardiac Risk Index (RCRI).  Therefore, she is at low risk for perioperative complications.   Her functional capacity is good at 7.71 METs according to the [facility] Activity Status Index (DASI). Recommendations: According to ACC/AHA guidelines, no further cardiovascular testing needed.  The patient may proceed to surgery at acceptable risk.   Antiplatelet and/or Anticoagulation Recommendations: Patient is not on anticoagulation or antiplatelet per review of medical record in Epic.    The patient was advised that if she develops new symptoms prior to surgery to contact our office to arrange for a follow-up visit, and she verbalized understanding.  A copy of this note will be routed to requesting surgeon.  Time:   Today, I have spent 10 minutes with the patient with telehealth technology  discussing medical history, symptoms, and management plan.    Pippa Hanif D Amor Packard, NP  04/13/2024, 10:48 AM

## 2024-04-14 ENCOUNTER — Encounter: Payer: Self-pay | Admitting: Surgery

## 2024-04-14 NOTE — Progress Notes (Signed)
 Perioperative / Anesthesia Services  Pre-Admission Testing Clinical Review / Pre-Operative Anesthesia Consult  Date: 04/15/24  PATIENT DEMOGRAPHICS: Name: Gina Ford DOB: [birth year] MRN:   [id number]  Note: Available PAT nursing documentation and vital signs have been reviewed. Clinical nursing staff has updated patient's PMH/PSHx, current medication list, and drug allergies/intolerances to ensure complete and comprehensive history available to assist care teams in MDM as it pertains to the aforementioned surgical procedure and anticipated anesthetic course. Extensive review of available clinical information personally performed. [place] PMH and PSHx updated with any diagnoses/procedures that  may have been inadvertently omitted during her intake with the pre-admission testing department's nursing staff.  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 female who is submitted for pre-surgical anesthesia review and clearance prior to her undergoing the above procedure. Patient has never been a smoker in the past. Pertinent PMH includes: CAD, PVCs, PSVT, aortic valve insufficiency, aortic atherosclerosis, HTN, HLD, prediabetes, hypothyroidism, GERD (no daily Tx), hiatal hernia, anemia, OA, scoliosis, glaucoma.  Patient is followed by cardiology Marsa, MD). She was last seen in the cardiology clinic on 01/28/2024; notes reviewed. At the time of her clinic visit, patient doing well overall from a cardiovascular perspective.  Patient reported minor infrequent episodes of palpitations, however noted that  they had markedly improved with the addition of her beta-blocker therapy.  Patient denied any chest pain, shortness of breath, PND, orthopnea, significant peripheral edema, weakness, fatigue, vertiginous symptoms, or presyncope/syncope. Patient with a past medical history significant for cardiovascular diagnoses. Documented physical exam was grossly benign, providing no evidence of acute exacerbation and/or decompensation of the patient's known cardiovascular conditions.  Most recent TTE performed on 10/15/2019 revealed a normal left ventricular systolic function with an EF of 60-65%. There were no regional wall motion abnormalities. Left ventricular diastolic Doppler parameters consistent with abnormal relaxation (G1DD).  Left atrium mildly dilated.  Right ventricular size and function normal with a TAPSE measuring 2.6 cm  (normal range >/= 1.6 cm).  There was mild mitral and mild to moderate aortic valve regurgitation.  All transvalvular gradients were noted to be normal providing no evidence of hemodynamically significant valvular stenosis. Aorta normal in size with no evidence of ectasia or aneurysmal dilatation.  Most recent myocardial perfusion imaging study was performed on 05/14/2023 revealing a  normal left ventricular systolic function with an EF of >65%.  There were no regional wall motion abnormalities.  No artifact or left ventricular cavity size enlargement appreciated on review of imaging. SPECT images demonstrated no evidence of stress-induced myocardial ischemia or arrhythmia; no scintigraphic evidence of scar.  TID ratio = 0.97. CT attenuation correction images revealed no significant coronary calcifications, however there were calcifications noted within the aorta.  Additionally, a large hiatal hernia was present. Study determined to be normal and low risk.  Long-term cardiac event monitor study performed on 01/03/2024 revealing a predominant underlying sinus rhythm with intermittent bundle  branch block.  Average rate 70 bpm; range 48-179 bpm.  There were 15 runs of SVT observed with the fastest interval lasting 8.9 seconds with a max rate of 179 bpm,  and the longest lasting 37.4 seconds and an average rate of 120 bpm.  There was rare atrial and ventricular ectopy.  Patient triggered events corresponded with PSVT runs.  Blood pressure uncontrolled at 168/80 mmHg on currently prescribed beta-blocker (metoprolol  succinate) and ARB/diuretic (valsartan /HCTZ)  therapies.  Patient is on pravastatin for her HLD diagnosis and ASCVD prevention.  Patient has a prediabetes diagnosis that she is managing with diet lifestyle modification.  Most recent hemoglobin A1c was borderline at 6.5% on 01/30/2024.  Patient does not have an OSAH diagnosis. Patient is able to complete all of her  ADL/IADLs without cardiovascular limitation.  Per the DASI, patient is able to achieve at least 4 METS of physical activity without experiencing any significant degree of angina/anginal equivalent symptoms. Due to her elevated blood pressure, medication regimen reviewed. In an effort to avoid hypotension that could result in falls, CCB (amlodipine ) was discontinued with the initiation of beta blocker therapy. With her elevated blood pressure, amlodipine  5 mg was restarted. No other changes were made to her medication regimen. Patient scheduled to follow-up with outpatient cardiology in 3 months or sooner if needed.  Gina Ford is scheduled for an elective REPAIR, HERNIA, INGUINAL, ROBOT-ASSISTED, LAPAROSCOPIC, USING MESH (Bilateral: Abdomen) on 04/16/2024 with Dr. [name], DO.  Given patient's past medical history significant for cardiovascular diagnoses, presurgical cardiac clearance was sought by the PAT team.  Per cardiology, Ms. Empson's perioperative risk of a major cardiac event is 0.4% according to the Revised Cardiac Risk Index (RCRI).  Therefore, she is at low risk for perioperative complications.   Her functional  capacity is good at 7.71 METs according to the [place] Activity Status Index (DASI). According to ACC/AHA guidelines, no further cardiovascular testing needed.  The patient may proceed to surgery at ACCEPTABLE risk.  In review of her medication reconciliation, the patient is not noted to be taking any type of anticoagulation or antiplatelet therapies that would need to be held during her perioperative course.  Patient denies previous perioperative complications with anesthesia in the past. In review her EMR, there are no records available for review pertaining to any anesthetic courses within the [facility] in the recent past.   MOST RECENT VITAL SIGNS:    01/28/2024    3:07 PM 01/28/2024    1:57 PM 12/10/2023    1:52 PM  Vitals with BMI  Height  5' 2 5' 2  Weight  139 lbs 3 oz 139 lbs 6 oz  BMI  25.45 25.49  Systolic 168 171 877  Diastolic 80 79 66  Pulse  56 80   PROVIDERS/SPECIALISTS: NOTE: Primary physician provider listed below. Patient may have been seen by APP or partner within same practice.   PROVIDER ROLE / SPECIALTY LAST [facility]ERLEAN Pierre Henriette, DO General Surgery (Surgeon) 02/17/2024  [name], MD Primary Care Provider 02/06/2024  Darron Grass, MD Cardiology 01/28/2024; preop APP call 04/13/2024   ALLERGIES: Allergies  Allergen Reactions   Doxazosin Itching and Rash   Univasc [Moexipril] Itching and Rash    CURRENT HOME MEDICATIONS:  ketoconazole  (NIZORAL ) 2 % shampoo    amLODipine  (NORVASC ) 5 MG tablet   cyanocobalamin  (VITAMIN B12) 1000 MCG/ML injection   hydrocortisone  2.5 % cream   ketoconazole  (NIZORAL ) 2 % shampoo   latanoprost (XALATAN) 0.005 % ophthalmic solution   levothyroxine (SYNTHROID, LEVOTHROID) 100 MCG tablet   pravastatin (PRAVACHOL) 40 MG tablet   timolol (TIMOPTIC) 0.5 % ophthalmic solution   trolamine salicylate (ASPERCREME) 10 % cream  valsartan -hydrochlorothiazide  (DIOVAN -HCT) 320-12.5 MG tablet   Vitamin D, Ergocalciferol,  (DRISDOL) 50000 units CAPS capsule   betamethasone  dipropionate 0.05 % lotion   celecoxib  (CELEBREX ) 200 MG capsule   metoprolol  succinate (TOPROL  XL) 25 MG 24 hr tablet   pimecrolimus  (ELIDEL ) 1 % cream   HISTORY: Past Medical History:  Diagnosis Date   Abnormal mammogram    Achilles tendonitis    Actinic keratosis    Allergic rhinitis    Anemia    Aortic atherosclerosis (HCC)    Aortic valve insufficiency, etiology of cardiac valve disease unspecified    Arthritis    B12 deficiency    Bilateral recurrent inguinal hernia without obstruction or gangrene    Coronary artery disease    Diverticulosis    Fibroid uterus    GERD (gastroesophageal reflux disease)    Glaucoma    History of hiatal hernia    HLD (hyperlipidemia)    HTN (hypertension)    Hypothyroidism    Low serum vitamin D 01/2015   Obesity    PONV (postoperative nausea and vomiting)    Pre-diabetes    PSVT (paroxysmal supraventricular tachycardia) (HCC)    PVC's (premature ventricular contractions)    Scoliosis    Past Surgical History:  Procedure Laterality Date   COLONOSCOPY     ESOPHAGOGASTRODUODENOSCOPY  2012, 2016   INGUINAL HERNIA REPAIR Left    KNEE ARTHROSCOPY Right    LOBECTOMY     Right Thyroid , then Left   THYROIDECTOMY  1991   TUBAL LIGATION     VARICOSE VEIN SURGERY     Family History  Problem Relation Age of Onset   Heart attack Father 53   Stroke Father    CVA Father    Arthritis Father    Stroke Mother 63   Hypertension Mother    Arthritis Mother    Colon polyps Mother    CVA Mother    Leukemia Sister 56   Melanoma Sister        STAGE IV MELANOMA OF THE BRAIN METASTATIC   Heart attack Other        grandfahter   Social History   Tobacco Use   Smoking status: Never   Smokeless tobacco: Never  Substance Use Topics   Alcohol use: Yes    Comment: glass of wine rarely   LABS:  No visits with results within 30 Day(s) from this visit.  Latest known visit with results is:   Orders Only on 01/07/2024  Component Date Value Ref Range Status   WBC 01/07/2024 5.0  3.4 - 10.8 x10E3/uL Final   RBC 01/07/2024 4.78  3.77 - 5.28 x10E6/uL Final   Hemoglobin 01/07/2024 14.3  11.1 - 15.9 g/dL Final   Hematocrit 94/93/7974 43.1  34.0 - 46.6 % Final   MCV 01/07/2024 90  79 - 97 fL Final   MCH 01/07/2024 29.9  26.6 - 33.0 pg Final   MCHC 01/07/2024 33.2  31.5 - 35.7 g/dL Final   RDW 94/93/7974 13.1  11.7 - 15.4 % Final   Platelets 01/07/2024 287  150 - 450 x10E3/uL Final   Glucose 01/07/2024 79  70 - 99 mg/dL Final   BUN 94/93/7974 12  8 - 27 mg/dL Final   Creatinine, Ser 01/07/2024 0.79  0.57 - 1.00 mg/dL Final   eGFR 94/93/7974 75  >59 mL/min/1.73 Final   BUN/Creatinine Ratio 01/07/2024 15  12 - 28 Final   Sodium 01/07/2024 139  134 - 144 mmol/L Final   Potassium 01/07/2024  4.6  3.5 - 5.2 mmol/L Final   Chloride 01/07/2024 101  96 - 106 mmol/L Final   CO2 01/07/2024 24  20 - 29 mmol/L Final   Calcium 01/07/2024 9.4  8.7 - 10.3 mg/dL Final   TSH 94/93/7974 3.530  0.450 - 4.500 uIU/mL Final   Magnesium 01/07/2024 2.0  1.6 - 2.3 mg/dL Final    ECG: Date: 95/91/7974  Time ECG obtained: 1352 PM Rate: 80 bpm Rhythm: normal sinus Axis (leads I and aVF): normal Intervals: PR 166 ms. QRS 80 ms. QTc 438 ms. ST segment and T wave changes: No evidence of acute T wave abnormalities or significant ST segment elevation or depression.  Evidence of a possible, age undetermined, prior infarct:  Yes; septal Comparison: Similar to previous tracing obtained on 08/13/2023   IMAGING / PROCEDURES: LONG TERM CARDIAC EVENT MONITOR STUDY performed on 01/03/2024 Patch Wear Time:  13 days and 4 hours (2025-04-12T11:23:44-0400 to 2025-04-25T15:40:37-0400) Patient had a min HR of 48 bpm, max HR of 179 bpm, and avg HR of 70 bpm.  Predominant underlying rhythm was Sinus Rhythm. Intermittent Bundle Branch Block was present.  15 Supraventricular Tachycardia runs occurred, the run with the  fastest interval lasting 8.9 secs with a max rate of 179 bpm, the longest lasting 37.4 secs with an avg rate of 120 bpm. Supraventricular Tachycardia was detected within +/- 45 seconds of symptomatic patient event(s). Rare PACs and rare PVCs.  MYOCARDIAL PERFUSION IMAGING STUDY (LEXISCAN ) performed on 05/14/2023 Left ventricular systolic function is normal (LVEF greater than 65%). No significant coronary artery calcification is identified.  Aortic atherosclerosis is noted. Large hiatal hernia is present, similar to the CT abdomen and pelvis from 08/13/2022. No significant change noted compared to prior study from 11/02/2019. This is a low risk study. Normal pharmacologic myocardial perfusion stress test without evidence of significant ischemia or scar.  CT ABDOMEN PELVIS W CONTRAST performed on 08/13/2022 No acute localizing process in the abdomen or pelvis. Large hiatal hernia. Colonic diverticulosis without evidence for diverticulitis. Fibroid uterus.   TRANSTHORACIC ECHOCARDIOGRAM performed on 10/15/2019 Left ventricular ejection fraction, by estimation, is 60 to 65%. The left ventricle has normal function. The left ventricle has no regional wall motion abnormalities. Left ventricular diastolic parameters are consistent with Grade I diastolic dysfunction (impaired relaxation).  Right ventricular systolic function is normal. The right ventricular size is normal.  Left atrial size was mildly dilated.  Mild mitral valve regurgitation.  Aortic valve regurgitation is mild to moderate.   IMPRESSION AND PLAN: Gina Ford has been referred for pre-anesthesia review and clearance prior to her undergoing the planned anesthetic and procedural courses. Available labs, pertinent testing, and imaging results were personally reviewed by me in preparation for upcoming operative/procedural course. [facility] medical record has been updated following extensive record review and patient interview with PAT  staff.   This patient has been appropriately cleared by cardiology with an overall ACCEPTABLE  risk of patient experiencing significant perioperative cardiovascular complications. Based on clinical review performed today (04/15/24), barring any significant acute changes in the patient's overall condition, it is anticipated that she will be able to proceed with the planned surgical intervention. Any acute changes in clinical condition may necessitate her procedure being postponed and/or cancelled. Patient will meet with anesthesia team (MD and/or CRNA) on the day of her procedure for preoperative evaluation/assessment. Questions regarding anesthetic course will be fielded at that time.   Pre-surgical instructions were reviewed with the patient during his PAT appointment, and  questions were fielded to satisfaction by PAT clinical staff. She has been instructed on which medications that she will need to hold prior to surgery, as well as the ones that have been deemed safe/appropriate to take on the day of her procedure. As part of the general education provided by PAT, patient made aware both verbally and in writing, that she would need to abstain from the use of any illegal substances during her perioperative course. She was advised that failure to follow the provided instructions could necessitate case cancellation or result in serious perioperative complications up to and including death. Patient encouraged to contact PAT and/or her surgeon's office to discuss any questions or concerns that may arise prior to surgery; verbalized understanding.   Dorise Pereyra, MSN, APRN, FNP-C, CEN [facility]  Perioperative Services Nurse Practitioner Phone: [phone] Fax: [phone] 04/15/24 11:24 AM  NOTE: This note has been prepared using Dragon dictation software. Despite my best ability to proofread, there is always the potential that unintentional transcriptional errors may still occur  from this process.

## 2024-04-15 MED ORDER — ORAL CARE MOUTH RINSE: 15.0000 mL | Freq: Once | OROMUCOSAL | Status: AC

## 2024-04-15 MED ORDER — CEFAZOLIN SODIUM-DEXTROSE 2-4 GM/100ML-% IV SOLN
2.0000 g | INTRAVENOUS | Status: AC
  Administered 2024-04-16: 2 g via INTRAVENOUS

## 2024-04-15 MED ORDER — LACTATED RINGERS IV SOLN: INTRAVENOUS | Status: DC

## 2024-04-15 MED ORDER — ACETAMINOPHEN 500 MG PO TABS
1000.0000 mg | ORAL_TABLET | ORAL | Status: AC
  Administered 2024-04-16: 1000 mg via ORAL

## 2024-04-15 MED ORDER — CELECOXIB 200 MG PO CAPS
200.0000 mg | ORAL_CAPSULE | ORAL | Status: AC
  Administered 2024-04-16: 200 mg via ORAL

## 2024-04-15 MED ORDER — CHLORHEXIDINE GLUCONATE CLOTH 2 % EX PADS: 6.0000 | MEDICATED_PAD | Freq: Once | CUTANEOUS | Status: DC

## 2024-04-15 MED ORDER — CHLORHEXIDINE GLUCONATE 0.12 % MT SOLN
15.0000 mL | Freq: Once | OROMUCOSAL | Status: AC
  Administered 2024-04-16: 15 mL via OROMUCOSAL

## 2024-04-16 ENCOUNTER — Other Ambulatory Visit: Payer: Self-pay

## 2024-04-16 ENCOUNTER — Encounter
Admission: RE | Disposition: A | Discharge status: Home / Self Care | Payer: Self-pay | Source: Home / Self Care | Attending: Surgery

## 2024-04-16 ENCOUNTER — Ambulatory Visit: Payer: Self-pay | Admitting: Urgent Care

## 2024-04-16 ENCOUNTER — Ambulatory Visit
Admission: RE | Admit: 2024-04-16 | Discharge: 2024-04-16 | Disposition: A | Discharge status: Home / Self Care | Attending: Surgery | Admitting: Surgery

## 2024-04-16 ENCOUNTER — Encounter: Payer: Self-pay | Admitting: Surgery

## 2024-04-16 DIAGNOSIS — I10 Essential (primary) hypertension: Secondary | ICD-10-CM | POA: Insufficient documentation

## 2024-04-16 DIAGNOSIS — I351 Nonrheumatic aortic (valve) insufficiency: Secondary | ICD-10-CM | POA: Diagnosis not present

## 2024-04-16 DIAGNOSIS — K219 Gastro-esophageal reflux disease without esophagitis: Secondary | ICD-10-CM | POA: Diagnosis not present

## 2024-04-16 DIAGNOSIS — E039 Hypothyroidism, unspecified: Secondary | ICD-10-CM | POA: Diagnosis not present

## 2024-04-16 DIAGNOSIS — E669 Obesity, unspecified: Secondary | ICD-10-CM | POA: Diagnosis not present

## 2024-04-16 DIAGNOSIS — R7303 Prediabetes: Secondary | ICD-10-CM | POA: Diagnosis not present

## 2024-04-16 DIAGNOSIS — I700 Atherosclerosis of aorta: Secondary | ICD-10-CM | POA: Diagnosis not present

## 2024-04-16 DIAGNOSIS — I2510 Atherosclerotic heart disease of native coronary artery without angina pectoris: Secondary | ICD-10-CM | POA: Insufficient documentation

## 2024-04-16 DIAGNOSIS — K4091 Unilateral inguinal hernia, without obstruction or gangrene, recurrent: Secondary | ICD-10-CM | POA: Insufficient documentation

## 2024-04-16 DIAGNOSIS — K4021 Bilateral inguinal hernia, without obstruction or gangrene, recurrent: Secondary | ICD-10-CM

## 2024-04-16 DIAGNOSIS — K4090 Unilateral inguinal hernia, without obstruction or gangrene, not specified as recurrent: Secondary | ICD-10-CM | POA: Diagnosis not present

## 2024-04-16 HISTORY — DX: Hypothyroidism, unspecified: E03.9

## 2024-04-16 HISTORY — DX: Leiomyoma of uterus, unspecified: D25.9

## 2024-04-16 HISTORY — DX: Diverticulosis of intestine, part unspecified, without perforation or abscess without bleeding: K57.90

## 2024-04-16 HISTORY — DX: Ventricular premature depolarization: I49.3

## 2024-04-16 HISTORY — PX: XI ROBOTIC ASSISTED INGUINAL HERNIA REPAIR WITH MESH: SHX6706

## 2024-04-16 HISTORY — DX: Supraventricular tachycardia, unspecified: I47.10

## 2024-04-16 SURGERY — REPAIR, HERNIA, INGUINAL, ROBOT-ASSISTED, LAPAROSCOPIC, USING MESH
Anesthesia: General | Site: Inguinal | Laterality: Bilateral

## 2024-04-16 MED ORDER — DEXAMETHASONE SODIUM PHOSPHATE 10 MG/ML IJ SOLN
INTRAMUSCULAR | Status: AC
  Filled 2024-04-16: qty 1

## 2024-04-16 MED ORDER — OXYCODONE HCL 5 MG/5ML PO SOLN: 5.0000 mg | Freq: Once | ORAL | Status: AC | PRN

## 2024-04-16 MED ORDER — BUPIVACAINE-EPINEPHRINE (PF) 0.5% -1:200000 IJ SOLN
INTRAMUSCULAR | Status: AC
  Filled 2024-04-16: qty 10

## 2024-04-16 MED ORDER — SEVOFLURANE IN SOLN
RESPIRATORY_TRACT | Status: AC
  Filled 2024-04-16: qty 250

## 2024-04-16 MED ORDER — ROCURONIUM BROMIDE 100 MG/10ML IV SOLN
INTRAVENOUS | Status: DC | PRN
  Administered 2024-04-16: 50 mg via INTRAVENOUS

## 2024-04-16 MED ORDER — ACETAMINOPHEN 500 MG PO TABS
ORAL_TABLET | ORAL | Status: AC
  Filled 2024-04-16: qty 2

## 2024-04-16 MED ORDER — LIDOCAINE HCL (PF) 2 % IJ SOLN
INTRAMUSCULAR | Status: AC
  Filled 2024-04-16: qty 5

## 2024-04-16 MED ORDER — FENTANYL CITRATE (PF) 100 MCG/2ML IJ SOLN
INTRAMUSCULAR | Status: DC | PRN
  Administered 2024-04-16 (×2): 50 ug via INTRAVENOUS

## 2024-04-16 MED ORDER — OXYCODONE HCL 5 MG PO TABS
ORAL_TABLET | ORAL | Status: AC
  Filled 2024-04-16: qty 1

## 2024-04-16 MED ORDER — LIDOCAINE HCL (CARDIAC) PF 100 MG/5ML IV SOSY
PREFILLED_SYRINGE | INTRAVENOUS | Status: DC | PRN
  Administered 2024-04-16: 80 mg via INTRAVENOUS

## 2024-04-16 MED ORDER — FENTANYL CITRATE (PF) 100 MCG/2ML IJ SOLN
INTRAMUSCULAR | Status: AC
  Filled 2024-04-16: qty 2

## 2024-04-16 MED ORDER — TRAMADOL HCL 50 MG PO TABS
50.0000 mg | ORAL_TABLET | Freq: Three times a day (TID) | ORAL | 0 refills | Status: DC | PRN
  Filled 2024-04-16: qty 6, 2d supply, fill #0

## 2024-04-16 MED ORDER — 0.9 % SODIUM CHLORIDE (POUR BTL) OPTIME
TOPICAL | Status: DC | PRN
  Administered 2024-04-16: 500 mL

## 2024-04-16 MED ORDER — ONDANSETRON HCL 4 MG/2ML IJ SOLN
INTRAMUSCULAR | Status: DC | PRN
  Administered 2024-04-16: 4 mg via INTRAVENOUS

## 2024-04-16 MED ORDER — DOCUSATE SODIUM 100 MG PO CAPS
100.0000 mg | ORAL_CAPSULE | Freq: Two times a day (BID) | ORAL | 0 refills | Status: AC | PRN
  Filled 2024-04-16: qty 20, 10d supply, fill #0

## 2024-04-16 MED ORDER — PHENYLEPHRINE HCL-NACL 20-0.9 MG/250ML-% IV SOLN
INTRAVENOUS | Status: AC
  Filled 2024-04-16: qty 250

## 2024-04-16 MED ORDER — BUPIVACAINE-EPINEPHRINE (PF) 0.5% -1:200000 IJ SOLN
INTRAMUSCULAR | Status: AC
  Filled 2024-04-16: qty 30

## 2024-04-16 MED ORDER — FENTANYL CITRATE (PF) 100 MCG/2ML IJ SOLN
25.0000 ug | INTRAMUSCULAR | Status: DC | PRN
  Administered 2024-04-16 (×2): 50 ug via INTRAVENOUS

## 2024-04-16 MED ORDER — ROCURONIUM BROMIDE 10 MG/ML (PF) SYRINGE
PREFILLED_SYRINGE | INTRAVENOUS | Status: AC
  Filled 2024-04-16: qty 10

## 2024-04-16 MED ORDER — OXYCODONE HCL 5 MG PO TABS
5.0000 mg | ORAL_TABLET | Freq: Once | ORAL | Status: AC | PRN
  Administered 2024-04-16: 5 mg via ORAL

## 2024-04-16 MED ORDER — HYDROCORTISONE 2.5 % EX CREA: 1.0000 | TOPICAL_CREAM | Freq: Every day | CUTANEOUS | Status: AC | PRN

## 2024-04-16 MED ORDER — SUGAMMADEX SODIUM 200 MG/2ML IV SOLN
INTRAVENOUS | Status: DC | PRN
  Administered 2024-04-16: 200 mg via INTRAVENOUS

## 2024-04-16 MED ORDER — DEXAMETHASONE SODIUM PHOSPHATE 10 MG/ML IJ SOLN
INTRAMUSCULAR | Status: DC | PRN
  Administered 2024-04-16: 5 mg via INTRAVENOUS

## 2024-04-16 MED ORDER — ACETAMINOPHEN 325 MG PO TABS
650.0000 mg | ORAL_TABLET | Freq: Three times a day (TID) | ORAL | 0 refills | Status: DC | PRN
Start: 2024-04-16 — End: 2024-05-07
  Filled 2024-04-16: qty 40, 7d supply, fill #0

## 2024-04-16 MED ORDER — ONDANSETRON HCL 4 MG/2ML IJ SOLN
INTRAMUSCULAR | Status: AC
Start: 2024-04-16 — End: 2024-04-16
  Filled 2024-04-16: qty 2

## 2024-04-16 MED ORDER — PROPOFOL 10 MG/ML IV BOLUS
INTRAVENOUS | Status: DC | PRN
  Administered 2024-04-16: 100 mg via INTRAVENOUS

## 2024-04-16 MED ORDER — EPHEDRINE SULFATE-NACL 50-0.9 MG/10ML-% IV SOSY
PREFILLED_SYRINGE | INTRAVENOUS | Status: DC | PRN
  Administered 2024-04-16: 5 mg via INTRAVENOUS

## 2024-04-16 MED ORDER — PROPOFOL 1000 MG/100ML IV EMUL
INTRAVENOUS | Status: AC
  Filled 2024-04-16: qty 100

## 2024-04-16 MED ORDER — BUPIVACAINE-EPINEPHRINE 0.5% -1:200000 IJ SOLN
INTRAMUSCULAR | Status: DC | PRN
  Administered 2024-04-16 (×2): 15 mL

## 2024-04-16 MED ORDER — CELECOXIB 200 MG PO CAPS
ORAL_CAPSULE | ORAL | Status: AC
  Filled 2024-04-16: qty 1

## 2024-04-16 MED ORDER — CHLORHEXIDINE GLUCONATE 0.12 % MT SOLN
OROMUCOSAL | Status: AC
  Filled 2024-04-16: qty 15

## 2024-04-16 MED ORDER — EPHEDRINE 5 MG/ML INJ
INTRAVENOUS | Status: AC
  Filled 2024-04-16: qty 5

## 2024-04-16 MED ORDER — CEFAZOLIN SODIUM-DEXTROSE 2-4 GM/100ML-% IV SOLN
INTRAVENOUS | Status: AC
  Filled 2024-04-16: qty 100

## 2024-04-16 SURGICAL SUPPLY — 38 items
BAG PRESSURE INF REUSE 1000 (BAG) IMPLANT
BNDG GAUZE DERMACEA FLUFF 4 (GAUZE/BANDAGES/DRESSINGS) ×1 IMPLANT
COVER TIP SHEARS 8 DVNC (MISCELLANEOUS) ×1 IMPLANT
COVER WAND RF STERILE (DRAPES) ×1 IMPLANT
DEFOGGER SCOPE WARM SEASHARP (MISCELLANEOUS) ×1 IMPLANT
DERMABOND ADVANCED .7 DNX12 (GAUZE/BANDAGES/DRESSINGS) ×1 IMPLANT
DRAPE ARM DVNC X/XI (DISPOSABLE) ×3 IMPLANT
DRAPE COLUMN DVNC XI (DISPOSABLE) ×1 IMPLANT
ELECTRODE REM PT RTRN 9FT ADLT (ELECTROSURGICAL) ×1 IMPLANT
FORCEPS BPLR FENES DVNC XI (FORCEP) ×1 IMPLANT
GLOVE BIOGEL PI IND STRL 7.0 (GLOVE) ×2 IMPLANT
GLOVE SURG SYN 6.5 PF PI (GLOVE) ×4 IMPLANT
GOWN STRL REUS W/ TWL LRG LVL3 (GOWN DISPOSABLE) ×4 IMPLANT
IRRIGATOR SUCT 8 DISP DVNC XI (IRRIGATION / IRRIGATOR) IMPLANT
IV NS 1000ML BAXH (IV SOLUTION) IMPLANT
LABEL OR SOLS (LABEL) IMPLANT
MANIFOLD NEPTUNE II (INSTRUMENTS) ×1 IMPLANT
MESH 3DMAX MID 4X6 LT LRG (Mesh General) IMPLANT
MESH 3DMAX MID 4X6 RT LRG (Mesh General) IMPLANT
NDL DRIVE SUT CUT DVNC (INSTRUMENTS) ×1 IMPLANT
NDL HYPO 22X1.5 SAFETY MO (MISCELLANEOUS) ×1 IMPLANT
NDL INSUFFLATION 14GA 120MM (NEEDLE) ×1 IMPLANT
NEEDLE DRIVE SUT CUT DVNC (INSTRUMENTS) ×1 IMPLANT
NEEDLE HYPO 22X1.5 SAFETY MO (MISCELLANEOUS) ×1 IMPLANT
NEEDLE INSUFFLATION 14GA 120MM (NEEDLE) ×1 IMPLANT
NS IRRIG 500ML POUR BTL (IV SOLUTION) IMPLANT
OBTURATOR OPTICALSTD 8 DVNC (TROCAR) ×1 IMPLANT
PACK LAP CHOLECYSTECTOMY (MISCELLANEOUS) ×1 IMPLANT
SCISSORS MNPLR CVD DVNC XI (INSTRUMENTS) ×1 IMPLANT
SEAL UNIV 5-12 XI (MISCELLANEOUS) ×3 IMPLANT
SET TUBE SMOKE EVAC HIGH FLOW (TUBING) ×1 IMPLANT
SOLUTION ELECTROSURG ANTI STCK (MISCELLANEOUS) ×1 IMPLANT
SUT STRATA 3-0 15 RB-1.5 (SUTURE) ×1 IMPLANT
SUT VIC AB 2-0 SH 27XBRD (SUTURE) ×1 IMPLANT
SUTURE MNCRL 4-0 27XMF (SUTURE) ×1 IMPLANT
SYR 30ML LL (SYRINGE) ×1 IMPLANT
TAPE TRANSPORE STRL 2 31045 (GAUZE/BANDAGES/DRESSINGS) ×1 IMPLANT
TRAP FLUID SMOKE EVACUATOR (MISCELLANEOUS) ×1 IMPLANT

## 2024-04-16 NOTE — Anesthesia Preprocedure Evaluation (Signed)
 Anesthesia Evaluation  Patient identified by MRN, date of birth, ID band Patient awake    Reviewed: Allergy & Precautions, NPO status , Patient's Chart, lab work & pertinent test results  History of Anesthesia Complications (+) PONV and history of anesthetic complications  Airway Mallampati: III  TM Distance: <3 FB Neck ROM: full    Dental  (+) Chipped   Pulmonary neg pulmonary ROS, neg shortness of breath   Pulmonary exam normal        Cardiovascular Exercise Tolerance: Good hypertension, Normal cardiovascular exam+ dysrhythmias      Neuro/Psych negative neurological ROS  negative psych ROS   GI/Hepatic Neg liver ROS, hiatal hernia,GERD  Controlled,,  Endo/Other  Hypothyroidism    Renal/GU      Musculoskeletal   Abdominal   Peds  Hematology negative hematology ROS (+)   Anesthesia Other Findings Past Medical History: No date: Abnormal mammogram No date: Achilles tendonitis No date: Actinic keratosis No date: Allergic rhinitis No date: Anemia No date: Aortic atherosclerosis (HCC) No date: Aortic valve insufficiency, etiology of cardiac valve  disease unspecified No date: Arthritis No date: B12 deficiency No date: Bilateral recurrent inguinal hernia without obstruction or  gangrene No date: Coronary artery disease No date: Diverticulosis No date: Fibroid uterus No date: GERD (gastroesophageal reflux disease) No date: Glaucoma No date: History of hiatal hernia No date: HLD (hyperlipidemia) No date: HTN (hypertension) No date: Hypothyroidism 01/2015: Low serum vitamin D No date: Obesity No date: PONV (postoperative nausea and vomiting) No date: Pre-diabetes No date: PSVT (paroxysmal supraventricular tachycardia) (HCC) No date: PVC's (premature ventricular contractions) No date: Scoliosis  Past Surgical History: No date: COLONOSCOPY 2012, 2016: ESOPHAGOGASTRODUODENOSCOPY No date: INGUINAL HERNIA  REPAIR; Left No date: KNEE ARTHROSCOPY; Right No date: LOBECTOMY     Comment:  Right Thyroid , then Left 1991: THYROIDECTOMY No date: TUBAL LIGATION No date: VARICOSE VEIN SURGERY  BMI    Body Mass Index: 24.87 kg/m      Reproductive/Obstetrics negative OB ROS                              Anesthesia Physical Anesthesia Plan  ASA: 3  Anesthesia Plan: General ETT   Post-op Pain Management:    Induction: Intravenous  PONV Risk Score and Plan: Ondansetron , Dexamethasone , Midazolam and Treatment may vary due to age or medical condition  Airway Management Planned: Oral ETT  Additional Equipment:   Intra-op Plan:   Post-operative Plan: Extubation in OR  Informed Consent: I have reviewed the patients History and Physical, chart, labs and discussed the procedure including the risks, benefits and alternatives for the proposed anesthesia with the patient or authorized representative who has indicated his/her understanding and acceptance.     Dental Advisory Given  Plan Discussed with: Anesthesiologist, CRNA and Surgeon  Anesthesia Plan Comments: (Patient consented for risks of anesthesia including but not limited to:  - adverse reactions to medications - damage to eyes, teeth, lips or other oral mucosa - nerve damage due to positioning  - sore throat or hoarseness - Damage to heart, brain, nerves, lungs, other parts of body or loss of life  Patient voiced understanding and assent.)        Anesthesia Quick Evaluation

## 2024-04-16 NOTE — Interval H&P Note (Signed)
 No change. Ok to proceed

## 2024-04-16 NOTE — Op Note (Signed)
 Preoperative diagnosis: bilateral, reducible, right initial, left recurrent inguinal Hernia.  Postoperative diagnosis: bilateral, reducible, right initial, left recurrent  inguinal Hernia  Procedure: Robotic assisted laparoscopic bilateral, reducible, right initial, left recurrent  inguinal hernia repair with mesh  Anesthesia: General  Surgeon: Dr. Tye  Wound Classification: Clean  Specimen: none  Complications: None  Estimated Blood Loss: 10mL   Indications:  inguinal hernia. Repair was indicated to avoid complications of incarceration, obstruction and pain, and a prosthetic mesh repair was elected.  See H&P for further details.  Findings: 1. Vas Deferens and cord structures identified and preserved 2. Bard 3D max medium weight mesh used for repair 3. Adequate hemostasis achieved  Description of procedure: The patient was taken to the operating room. A time-out was completed verifying correct patient, procedure, site, positioning, and implant(s) and/or special equipment prior to beginning this procedure.  Area was prepped and draped in the usual sterile fashion. An incision was marked 20 cm above the pubic tubercle, slightly above the umbilicus    Veress needle inserted at palmer's point.  Saline drop test noted to be positive with gradual increase in pressure after initiation of gas insufflation.  15 mm of pressure was achieved prior to removing the Veress needle and then placing a 8 mm port via the Optiview technique through the supraumbilical site.  Inspection of the area afterwards noted no injury to the surrounding organs during insertion of the needle and the port.  2 port sites were marked 8 cm to the lateral sides of the initial port, and a 8 mm robotic port was placed on the left side, another 8 mm robotic port on the right side under direct supervision.  Local anesthesia  infused to the preplanned incision sites prior to insertion of the port.  The [employer] platform was  then brought into the operative field and docked to the ports.  Examination of the abdominal cavity noted a bilateral, reducible, right initial, left recurrent  inguinal hernia.  Right side addressed first. A peritoneal flap was created approximately 8cm cephalad to the defect by using scissors with electrocautery.  Dissection was carried down towards the pubic tubercle, developing the myopectineal orifice view.  Laterally the flap was carried towards the ASIS.  Small hernia sac was noted, which carefully dissected away from the adjacent tissues to be fully reduced out of hernia cavity.  Any bleeding was controlled with combination of electrocautery and manual pressure.    After confirming adequate dissection and the peritoneal reflection completely down and away from the cord structures, a Large Bard 3DMax medium weight mesh was placed within the anterior abdominal wall, secured in place using 2-0 Vicryl on an SH needle immediately above the pubic tubercle.  After noting proper placement of the mesh with the peritoneal reflection deep to it, the previously created peritoneal flap was secured back up to the anterior abdominal wall using running 3-0 V-Lock. 2-0 vicrl x1 used to close tear near round ligament.  All needles were then removed out of the abdominal cavity.  Left side addressed next. A peritoneal flap was created approximately 8cm cephalad to the defect by using scissors with electrocautery.  Dissection was carried down towards the pubic tubercle, developing the myopectineal orifice view.  Laterally the flap was carried towards the ASIS.  Small hernia sac was noted, which carefully dissected away from the adjacent tissues to be fully reduced out of hernia cavity.  Any bleeding was controlled with combination of electrocautery and manual pressure.  After confirming adequate dissection and the peritoneal reflection completely down and away from the cord structures, a Large Bard 3DMax medium weight  mesh was placed within the anterior abdominal wall, secured in place using 2-0 Vicryl on an SH needle immediately above the pubic tubercle.  After noting proper placement of the mesh with the peritoneal reflection deep to it, the previously created peritoneal flap was secured back up to the anterior abdominal wall using running 3-0 V-Lock.  Both needles were then removed out of the abdominal cavity. Xi platform undocked from the ports and removed off of operative field.  Marcaine  infused as ilioinguinal block bilaterally.  Abdomen then desufflated and ports removed. All the skin incisions were then closed with a subcuticular stitch of Monocryl 4-0. Dermabond was applied.The patient tolerated the procedure well and was taken to the postanesthesia care unit in stable condition. Sponge and instrument count correct at end of procedure.

## 2024-04-16 NOTE — H&P (Signed)
 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   History of Present Illness Gina Ford is an 83 year old female who presents with bilateral inguinal hernias.  She has previously undergone surgical fixation with mesh for one of the hernias but cannot recall which side was treated. Over the past three months, she has experienced swelling and protrusion on the right side. She has random episodes of pain in the right inguinal area, with a maximum intensity of 3 out of 10, lasting a few minutes. The pain is not associated with specific activities. A CT scan of the abdomen and pelvis with contrast on August 13, 2022, showed no issues in the inguinal region. The left side also shows swelling, but it is less pronounced than the right.  Surgery done 30-71yrs ago   Past Medical History:  has a past medical history of Achilles tendonitis, Actinic keratosis, Allergic rhinitis, Anemia, Arthritis, GERD (gastroesophageal reflux disease), Glaucoma, Hypercholesterolemia, Hypertension, Hypothyroidism, Osteopenia, Palmar fascial fibromatosis (dupuytren), and Scoliosis.  Past Surgical History:  Past Surgical History:  Procedure Laterality Date   thyroid  nodule removed  1991   2nd thyroid  surgery for thyroidectomy     COLONOSCOPY     EGD     HERNIA REPAIR     KNEE ARTHROSCOPY     TUBAL LIGATION      Family History: family history includes Arthritis in her father and mother; Heart disease in her sister and sister; Stroke in her father and mother.  Social History:  reports that she has never smoked. She has never used smokeless tobacco. She reports current alcohol use of about 5.0 standard drinks of alcohol per week. She reports that she does not use drugs.  Current Medications: has a current medication list which includes the following prescription(s): acetaminophen , amlodipine ,  cyanocobalamin , hydrocortisone , hydroxyzine hcl, ketoconazole , latanoprost, levothyroxine, metoprolol  succinate, pravastatin, timolol hemihydrate, timolol maleate, and valsartan -hydrochlorothiazide , and the following Facility-Administered Medications: cyanocobalamin .  Allergies:  Allergies as of 02/17/2024 - Reviewed 02/17/2024  Allergen Reaction Noted   Doxazosin Itching 09/19/2010   Univasc [moexipril] Itching 12/17/2019    ROS:  A 15 point review of systems was performed and pertinent positives and negatives noted in HPI   Objective:     BP 116/64   Pulse 58   Ht 157.5 cm (5' 2)   Wt 60.8 kg (134 lb)   BMI 24.51 kg/m   Constitutional :  Alert, cooperative, no distress  Lymphatics/Throat:  Supple, no lymphadenopathy  Respiratory:  clear to auscultation bilaterally  Cardiovascular:  regular rate and rhythm  Gastrointestinal: soft, non-tender; bowel sounds normal; no masses,  no organomegaly. inguinal hernia noted.  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 The risks of general anesthetic, if  used, includes MI, CVA, sudden death or even reaction to anesthetic medications also discussed. Alternatives include continued observation.  Benefits include possible symptom relief, prevention of incarceration,  strangulation, enlargement in size over time, and the risk of emergency surgery in the face of strangulation.   Typical post-op recovery time of 3-5 days with 2 weeks of activity restrictions were also discussed.  ED return precautions given for sudden increase in pain, size of hernia with accompanying fever, nausea, and/or vomiting.  The patient verbalized understanding and all questions were answered to the patient's satisfaction.   2. Patient has elected to proceed with surgical treatment. Procedure will be scheduled. bilateral, robotic assisted laparoscopic  labs/images/medications/previous chart entries reviewed personally and relevant changes/updates noted above.

## 2024-04-16 NOTE — Transfer of Care (Signed)
 Immediate Anesthesia Transfer of Care Note  Patient: Gina Ford  Procedure(s) Performed: REPAIR, HERNIA, INGUINAL, ROBOT-ASSISTED, LAPAROSCOPIC, USING MESH (Bilateral: Inguinal)  Patient Location: PACU  Anesthesia Type:General  Level of Consciousness: awake  Airway & Oxygen Therapy: Patient Spontanous Breathing and Patient connected to face mask oxygen  Post-op Assessment: Report given to RN and Post -op Vital signs reviewed and stable  Post vital signs: Reviewed and stable  Last Vitals:  Vitals Value Taken Time  BP 156/58 04/16/24 11:15  Temp    Pulse 44 04/16/24 11:17  Resp 14 04/16/24 11:17  SpO2 100 % 04/16/24 11:17  Vitals shown include unfiled device data.  Last Pain:  Vitals:   04/16/24 0739  TempSrc: Tympanic         Complications: No notable events documented.

## 2024-04-16 NOTE — Anesthesia Procedure Notes (Signed)
 Procedure Name: Intubation Date/Time: 04/16/2024 9:31 AM  Performed by: Anice Melnick, CRNAPre-anesthesia Checklist: Patient identified, Patient being monitored, Timeout performed, Emergency Drugs available and Suction available Patient Re-evaluated:Patient Re-evaluated prior to induction Oxygen Delivery Method: Circle system utilized Preoxygenation: Pre-oxygenation with 100% oxygen Induction Type: IV induction Ventilation: Mask ventilation without difficulty Laryngoscope Size: Mac, 3 and McGrath Grade View: Grade I Tube type: Oral Tube size: 6.5 mm Number of attempts: 1 Airway Equipment and Method: Stylet Placement Confirmation: ETT inserted through vocal cords under direct vision, positive ETCO2 and breath sounds checked- equal and bilateral Secured at: 21 cm Tube secured with: Tape Dental Injury: Teeth and Oropharynx as per pre-operative assessment

## 2024-04-16 NOTE — Anesthesia Postprocedure Evaluation (Signed)
 Anesthesia Post Note  Patient: Gina Ford  Procedure(s) Performed: REPAIR, HERNIA, INGUINAL, ROBOT-ASSISTED, LAPAROSCOPIC, USING MESH (Bilateral: Inguinal)  Patient location during evaluation: PACU Anesthesia Type: General Level of consciousness: awake and alert Pain management: pain level controlled Vital Signs Assessment: post-procedure vital signs reviewed and stable Respiratory status: spontaneous breathing, nonlabored ventilation and respiratory function stable Cardiovascular status: blood pressure returned to baseline and stable Postop Assessment: no apparent nausea or vomiting Anesthetic complications: no   No notable events documented.   Last Vitals:  Vitals:   04/16/24 1215 04/16/24 1239  BP: (!) 130/48 (!) 144/47  Pulse: (!) 46 (!) 50  Resp: (!) 9   Temp:  36.6 C  SpO2: 94% 95%    Last Pain:  Vitals:   04/16/24 1239  TempSrc: Temporal  PainSc: 3                  Fairy POUR Jasper Hanf

## 2024-04-16 NOTE — Discharge Instructions (Signed)
 Hernia repair, Care After ?This sheet gives you information about how to care for yourself after your procedure. Your health care provider may also give you more specific instructions. If you have problems or questions, contact your health care provider. ?What can I expect after the procedure? ?After your procedure, it is common to have the following: ?Pain in your abdomen, especially in the incision areas. You will be given medicine to control the pain. ?Tiredness. This is a normal part of the recovery process. Your energy level will return to normal over the next several weeks. ?Changes in your bowel movements, such as constipation or needing to go more often. Talk with your health care provider about how to manage this. ?Follow these instructions at home: ?Medicines ? tylenol and advil as needed for discomfort.  Please alternate between the two every four hours as needed for pain.   ? Use narcotics, if prescribed, only when tylenol and motrin is not enough to control pain. ? 325-650mg  every 8hrs to max of 3000mg /24hrs (including the 325mg  in every norco dose) for the tylenol.   ? Advil up to 800mg  per dose every 8hrs as needed for pain.   ?PLEASE RECORD NUMBER OF PILLS TAKEN UNTIL NEXT FOLLOW UP APPT.  THIS WILL HELP DETERMINE HOW READY YOU ARE TO BE RELEASED FROM ANY ACTIVITY RESTRICTIONS ?Do not drive or use heavy machinery while taking prescription pain medicine. ?Do not drink alcohol while taking prescription pain medicine. ? ?Incision care ? ?  ?Follow instructions from your health care provider about how to take care of your incision areas. Make sure you: ?Keep your incisions clean and dry. ?Wash your hands with soap and water before and after applying medicine to the areas, and before and after changing your bandage (dressing). If soap and water are not available, use hand sanitizer. ?Change your dressing as told by your health care provider. ?Leave stitches (sutures), skin glue, or adhesive strips in  place. These skin closures may need to stay in place for 2 weeks or longer. If adhesive strip edges start to loosen and curl up, you may trim the loose edges. Do not remove adhesive strips completely unless your health care provider tells you to do that. ?Do not wear tight clothing over the incisions. Tight clothing may rub and irritate the incision areas, which may cause the incisions to open. ?Do not take baths, swim, or use a hot tub until your health care provider approves. OK TO SHOWER IN 24HRS.   ?Check your incision area every day for signs of infection. Check for: ?More redness, swelling, or pain. ?More fluid or blood. ?Warmth. ?Pus or a bad smell. ?Activity ?Avoid lifting anything that is heavier than 10 lb (4.5 kg) for 2 weeks or until your health care provider says it is okay. ?No pushing/pulling greater than 30lbs ?You may resume normal activities as told by your health care provider. Ask your health care provider what activities are safe for you. ?Take rest breaks during the day as needed. ?Eating and drinking ?Follow instructions from your health care provider about what you can eat after surgery. ?To prevent or treat constipation while you are taking prescription pain medicine, your health care provider may recommend that you: ?Drink enough fluid to keep your urine clear or pale yellow. ?Take over-the-counter or prescription medicines. ?Eat foods that are high in fiber, such as fresh fruits and vegetables, whole grains, and beans. ?Limit foods that are high in fat and processed sugars, such as fried and  sweet foods. ?General instructions ?Ask your health care provider when you will need an appointment to get your sutures or staples removed. ?Keep all follow-up visits as told by your health care provider. This is important. ?Contact a health care provider if: ?You have more redness, swelling, or pain around your incisions. ?You have more fluid or blood coming from the incisions. ?Your incisions feel  warm to the touch. ?You have pus or a bad smell coming from your incisions or your dressing. ?You have a fever. ?You have an incision that breaks open (edges not staying together) after sutures or staples have been removed. ?You develop a rash. ?You have chest pain or difficulty breathing. ?You have pain or swelling in your legs. ?You feel light-headed or you faint. ?Your abdomen swells (becomes distended). ?You have nausea or vomiting. ?You have blood in your stool (feces). ?This information is not intended to replace advice given to you by your health care provider. Make sure you discuss any questions you have with your health care provider. ?Document Released: 03/09/2005 Document Revised: 05/09/2018 Document Reviewed: 05/21/2016 ?[employer] Interactive Patient Education ? 2019 [employer] ?  ? ?

## 2024-04-17 ENCOUNTER — Encounter: Payer: Self-pay | Admitting: Surgery

## 2024-05-05 DIAGNOSIS — E538 Deficiency of other specified B group vitamins: Secondary | ICD-10-CM | POA: Diagnosis not present

## 2024-05-06 NOTE — Progress Notes (Unsigned)
 Cardiology Office Note    Date:  05/07/2024   ID:  [name], DOB Aug 15, 1941, MRN [id number]  PCP:  Gina Bail, MD  Cardiologist:  [name], MD  Electrophysiologist:  None   Chief Complaint: Follow up  History of Present Illness:   Gina Ford is a 83 y.o. female with history of CAD, aortic atherosclerosis, aortic valve insufficiency, PSVT, PVCs, HTN, HLD, large hiatal hernia, and hypothyroidism on replacement therapy who presents for follow up of CAD and PSVT.   She was evaluated in 2012 for atypical chest pain and underwent treadmill MPI that showed no evidence of ischemia with a normal EF.  Zio patch in 2021 showed sinus rhythm with an average rate of 71 bpm.  There were occasional PVCs with an overall burden of 2.7% as well as one 10-beat run of WCT.  In this setting she underwent Lexiscan  MPI that showed no evidence of ischemia with a normal EF.  Echo showed an EF of 60 to 65%, grade 1 diastolic dysfunction, mild mitral regurgitation, and mild to moderate aortic insufficiency.  Prior renal artery duplex has shown no evidence of RAS.  In the summer 2024 she was under increased stress and had some chest pain.  She subsequently underwent Lexiscan  MPI in 05/2023 that showed no evidence of ischemia or scar with an EF greater than 65% and was overall low risk.  Coronary artery calcification and aortic atherosclerosis noted on CT attenuation corrected imaging.  Overall, this was a low risk study.  She was then seen in the office in 12/2023 noting an increase in palpitations with heart rates trending into the 120s bpm.  She remained active at baseline without cardiac limitation.  Subsequent Zio patch showed a predominant rhythm of sinus with an average rate of 70 bpm along with 15 episodes of SVT lasting up to 37.4 seconds with SVT being detected with symptomatic patient events.  In this setting she was started on Toprol -XL 25 mg.  She was last seen in the office in 01/2024 and was  doing well, continuing to note some palpitations.  She remained active at baseline without cardiac limitation.  She was continued on Toprol -XL 25 mg daily.  With elevated blood pressure, she was restarted on amlodipine  5 mg.  She underwent robotic assisted laparoscopic inguinal hernia repair on 04/16/2024 without cardiac complication.  She comes in doing well from a cardiac perspective and is without symptoms of angina or cardiac decompensation.  She notes stable paroxysms of palpitations.  She also reports an episode of dizziness while standing at the kitchen sink lasted for couple seconds and spontaneously resolved.  No frank syncope.  No associated palpitations with this.  No falls or symptoms concerning for bleeding.  She does note some mild ankle/lower extremity swelling.  Not currently wearing compression socks.   Labs independently reviewed: 01/2024 - Hgb 14.6, PLT 265, potassium 4.0, BUN 14, serum creatinine 0.7, albumin 4.1, AST/ALT normal, A1c 6.5, TC 189, TG 97, HDL 70, LDL 99, TSH 8.83, free T4 elevated at 1.26, magnesium 2.0  Past Medical History:  Diagnosis Date   Abnormal mammogram    Achilles tendonitis    Actinic keratosis    Allergic rhinitis    Anemia    Aortic atherosclerosis (HCC)    Aortic valve insufficiency, etiology of cardiac valve disease unspecified    Arthritis    B12 deficiency    Bilateral recurrent inguinal hernia without obstruction or gangrene    Coronary artery disease  Diverticulosis    Fibroid uterus    GERD (gastroesophageal reflux disease)    Glaucoma    History of hiatal hernia    HLD (hyperlipidemia)    HTN (hypertension)    Hypothyroidism    Low serum vitamin D 01/2015   Obesity    PONV (postoperative nausea and vomiting)    Pre-diabetes    PSVT (paroxysmal supraventricular tachycardia) (HCC)    PVC's (premature ventricular contractions)    Scoliosis     Past Surgical History:  Procedure Laterality Date   COLONOSCOPY      ESOPHAGOGASTRODUODENOSCOPY  2012, 2016   INGUINAL HERNIA REPAIR Left    KNEE ARTHROSCOPY Right    LOBECTOMY     Right Thyroid , then Left   THYROIDECTOMY  1991   TUBAL LIGATION     VARICOSE VEIN SURGERY     XI ROBOTIC ASSISTED INGUINAL HERNIA REPAIR WITH MESH Bilateral 04/16/2024   Procedure: REPAIR, HERNIA, INGUINAL, ROBOT-ASSISTED, LAPAROSCOPIC, USING MESH;  Surgeon: Tye Millet, DO;  Location: [facility] ORS;  Service: General;  Laterality: Bilateral;    Current Medications: Current Meds  Medication Sig   atorvastatin  (LIPITOR) 40 MG tablet Take 1 tablet (40 mg total) by mouth daily.   celecoxib  (CELEBREX ) 200 MG capsule Take 200 mg by mouth daily.   cyanocobalamin  (VITAMIN B12) 1000 MCG/ML injection Inject 1,000 mcg into the muscle every 30 (thirty) days.   hydrocortisone  2.5 % cream Apply 1 Application topically daily as needed (hemorrhoids).   ketoconazole  (NIZORAL ) 2 % shampoo MASSAGE INTO SCALP, LET SIT SEVERAL MINUTES BEFORE RINSING. (Patient taking differently: Apply 1 Application topically daily as needed (psoriasis).)   latanoprost (XALATAN) 0.005 % ophthalmic solution Place 1 drop into both eyes at bedtime.   levothyroxine (SYNTHROID, LEVOTHROID) 100 MCG tablet Take 100 mcg by mouth daily before breakfast.   metoprolol  succinate (TOPROL  XL) 25 MG 24 hr tablet Take 1 tablet (25 mg total) by mouth daily.   timolol (TIMOPTIC) 0.5 % ophthalmic solution Place 1 drop into both eyes in the morning.   valsartan -hydrochlorothiazide  (DIOVAN -HCT) 320-12.5 MG tablet Take 1 tablet by mouth daily.   Vitamin D, Ergocalciferol, (DRISDOL) 50000 units CAPS capsule Take 50,000 Units by mouth every 7 (seven) days.   [DISCONTINUED] amLODipine  (NORVASC ) 5 MG tablet Take 1 tablet (5 mg total) by mouth daily.   [DISCONTINUED] pravastatin (PRAVACHOL) 40 MG tablet Take 40 mg by mouth daily.    Allergies:   Doxazosin and Univasc [moexipril]   Social History   Socioeconomic History   Marital status:  Married    Spouse name: Gina Ford (Spouse)   Number of children: 1   Years of education: Not on file   Highest education level: Not on file  Occupational History   Occupation: RETIRED  Tobacco Use   Smoking status: Never   Smokeless tobacco: Never  Vaping Use   Vaping status: Never Used  Substance and Sexual Activity   Alcohol use: Yes    Comment: glass of wine rarely   Drug use: No   Sexual activity: Not on file  Other Topics Concern   Not on file  Social History Narrative   Not on file   Social Drivers of Health   Financial Resource Strain: Low Risk  (02/17/2024)   Received from [facility]   Overall Financial Resource Strain (CARDIA)    Difficulty of Paying Living Expenses: Not hard at all  Food Insecurity: No Food Insecurity (02/17/2024)   Received from [facility]  Hunger Vital Sign    Within the past 12 months, you worried that your food would run out before you got the money to buy more.: Never true    Within the past 12 months, the food you bought just didn't last and you didn't have money to get more.: Never true  Transportation Needs: No Transportation Needs (02/17/2024)   Received from [facility] - Transportation    In the past 12 months, has lack of transportation kept you from medical appointments or from getting medications?: No    Lack of Transportation (Non-Medical): No  Physical Activity: Not on file  Stress: Not on file  Social Connections: Not on file     Family History:  The patient's family history includes Arthritis in her father and mother; CVA in her father and mother; Colon polyps in her mother; Heart attack in an other family member; Heart attack (age of onset: 44) in her father; Hypertension in her mother; Leukemia (age of onset: 50) in her sister; Melanoma in her sister; Stroke in her father; Stroke (age of onset: 18) in her mother.  ROS:   12-point review of systems is  negative unless otherwise noted in the HPI.   EKGs/Labs/Other Studies Reviewed:    Studies reviewed were summarized above. The additional studies were reviewed today:  Zio patch 12/2023: Patient had a min HR of 48 bpm, max HR of 179 bpm, and avg HR of 70 bpm. Predominant underlying rhythm was Sinus Rhythm. Intermittent Bundle Branch Block was present. 15 Supraventricular Tachycardia runs occurred, the run with the fastest interval lasting 8.9 secs with a max rate of 179 bpm, the longest lasting 37.4 secs with an avg rate of 120 bpm. Supraventricular Tachycardia was detected within +/- 45 seconds of symptomatic patient event(s). Rare PACs and rare PVCs. __________   Lexiscan  MPI 05/14/2023:   Normal pharmacologic myocardial perfusion stress test without evidence of significant ischemia or scar.   Left ventricular systolic function is normal (LVEF greater than 65%).   No significant coronary artery calcification is identified.  Aortic atherosclerosis is noted.   Large hiatal hernia is present, similar to the CT abdomen and pelvis from 08/13/2022.   No significant change noted compared to prior study from 11/02/2019.   This is a low risk study. __________   Renal artery ultrasound/12/2020: Summary:  Largest Aortic Diameter: 2.1 cm    Renal:    Right: Normal size right kidney. Abnormal right Resistive Index.         Normal cortical thickness of right kidney. Cyst(s) noted. RRV         flow present. 1-59% stenosis of the right renal artery.  Left:  Normal size of left kidney. Abnormal left Resisitve Index.         Normal cortical thickness of the left kidney. LRV flow         present. 1-59% stenosis of the left renal artery.  Mesenteric:  Normal Celiac artery and Superior Mesenteric artery findings.  __________   Lexiscan  MPI 11/02/2019:   There was no ST segment deviation noted during stress. No T wave inversion was noted during stress. The study is normal. This is a low risk  study. The left ventricular ejection fraction is normal (55-65%). __________   2D echo 10/15/2019: 1. Left ventricular ejection fraction, by estimation, is 60 to 65%. The  left ventricle has normal function. The left ventricle has no regional  wall motion abnormalities. Left ventricular diastolic parameters  are  consistent with Grade I diastolic  dysfunction (impaired relaxation).   2. Right ventricular systolic function is normal. The right ventricular  size is normal.   3. Left atrial size was mildly dilated.   4. Mild mitral valve regurgitation.   5. Aortic valve regurgitation is mild to moderate.  __________   Zio patch 09/2019: Normal sinus rhythm with an average heart rate of 71 bpm. 5 episodes of wide-complex tachycardia likely ventricular tachycardia.  The longest lasted 10 beats.  SVT with aberrancy cannot be completely excluded. 8 episodes of supraventricular tachycardia the longest lasted 11 beats. Occasional PVCs with a burden of 2.7%. __________   See CV studies in Epic for more remote imaging   EKG:  EKG is ordered today.  The EKG ordered today demonstrates NSR, 62 bpm, nonspecific anterior ST-T changes consistent with prior tracings  Recent Labs: 01/07/2024: BUN 12; Creatinine, Ser 0.79; Hemoglobin 14.3; Magnesium 2.0; Platelets 287; Potassium 4.6; Sodium 139; TSH 3.530  Recent Lipid Panel No results found for: CHOL, TRIG, HDL, CHOLHDL, VLDL, LDLCALC, LDLDIRECT  PHYSICAL EXAM:    VS:  BP (!) 120/59 (BP Location: Left Arm, Patient Position: Sitting, Cuff Size: Normal)   Pulse 62   Ht 5' 2 (1.575 m)   Wt 139 lb 6.4 oz (63.2 kg)   SpO2 100%   BMI 25.50 kg/m   BMI: Body mass index is 25.5 kg/m.  Physical Exam Vitals reviewed.  Constitutional:      Appearance: She is well-developed.  HENT:     Head: Normocephalic and atraumatic.  Eyes:     General:        Right eye: No discharge.        Left eye: No discharge.  Cardiovascular:     Rate and  Rhythm: Normal rate and regular rhythm.     Heart sounds: Normal heart sounds, S1 normal and S2 normal. Heart sounds not distant. No midsystolic click and no opening snap. No murmur heard.    No friction rub.  Pulmonary:     Effort: Pulmonary effort is normal. No respiratory distress.     Breath sounds: Normal breath sounds. No decreased breath sounds, wheezing, rhonchi or rales.  Musculoskeletal:     Cervical back: Normal range of motion.     Comments: Trivial bilateral pretibial edema.  Skin:    General: Skin is warm and dry.     Nails: There is no clubbing.  Neurological:     Mental Status: She is alert and oriented to person, place, and time.  Psychiatric:        Speech: Speech normal.        Behavior: Behavior normal.        Thought Content: Thought content normal.        Judgment: Judgment normal.     Wt Readings from Last 3 Encounters:  05/07/24 139 lb 6.4 oz (63.2 kg)  04/16/24 136 lb (61.7 kg)  01/28/24 139 lb 3.2 oz (63.1 kg)     ASSESSMENT & PLAN:   Palpitations/PSVT/PVCs: Symptoms overall stable on Toprol -XL 25 mg daily.  She believes her episode of dizziness was exacerbated by dehydration and poor water intake.  Update echo as outlined below.  Aortic insufficiency: Mild to moderate by echo in 2021.  Schedule echo.  HTN: Blood pressure is well-controlled in the office today.  Remains on amlodipine  5 mg, Toprol -XL 25 mg, and valsartan /HCTZ 320/12.5 mg.  Recent labs stable.  CAD/aortic atherosclerosis/HLD: No symptoms suggestive of angina or  cardiac decompensation.  LDL 89 in 01/2024 with target LDL less than 70.  Stop pravastatin.  Start atorvastatin  40 mg daily.  Recheck fasting lipid panel and LFT in 2 months with recommendation to escalate lipid-lowering therapy as indicated to achieve target LDL.     Disposition: F/u with Dr. Darron or an APP in 6 months.   Medication Adjustments/Labs and Tests Ordered: Current medicines are reviewed at length with the patient  today.  Concerns regarding medicines are outlined above. Medication changes, Labs and Tests ordered today are summarized above and listed in the Patient Instructions accessible in Encounters.   Signed, Bernardino Bring, [name] 05/07/2024 2:30 PM     [facility] [address] [place], [place] [address] [phone]

## 2024-05-07 ENCOUNTER — Encounter: Payer: Self-pay | Admitting: Physician Assistant

## 2024-05-07 ENCOUNTER — Ambulatory Visit: Attending: Physician Assistant | Admitting: Physician Assistant

## 2024-05-07 VITALS — BP 120/59 | HR 62 | Ht 62.0 in | Wt 139.4 lb

## 2024-05-07 DIAGNOSIS — I471 Supraventricular tachycardia, unspecified: Secondary | ICD-10-CM

## 2024-05-07 DIAGNOSIS — I10 Essential (primary) hypertension: Secondary | ICD-10-CM | POA: Diagnosis not present

## 2024-05-07 DIAGNOSIS — R002 Palpitations: Secondary | ICD-10-CM | POA: Diagnosis not present

## 2024-05-07 DIAGNOSIS — I493 Ventricular premature depolarization: Secondary | ICD-10-CM

## 2024-05-07 DIAGNOSIS — E785 Hyperlipidemia, unspecified: Secondary | ICD-10-CM | POA: Diagnosis not present

## 2024-05-07 DIAGNOSIS — I351 Nonrheumatic aortic (valve) insufficiency: Secondary | ICD-10-CM | POA: Diagnosis not present

## 2024-05-07 MED ORDER — ATORVASTATIN CALCIUM 40 MG PO TABS: 40.0000 mg | ORAL_TABLET | Freq: Every day | ORAL | 3 refills | Status: DC

## 2024-05-07 MED ORDER — AMLODIPINE BESYLATE 5 MG PO TABS: 5.0000 mg | ORAL_TABLET | Freq: Every day | ORAL | 3 refills | Status: AC

## 2024-05-07 NOTE — Patient Instructions (Signed)
 Medication Instructions:  - STOP pravastatin - START atorvastatin  40 mg daily   *If you need a refill on your cardiac medications before your next appointment, please call your pharmacy*  Lab Work: Your provider would like for you to return in 2 months to have the following labs drawn: lipid panel, liver function test.  Please go to [facility] [address] ([facility] [address], [place] [address] You do not need an appointment.  They are open from 8 am- 4:30 pm.  Lunch from 1:00 pm- 2:00 pm You DO need to be fasting.  If you have labs (blood work) drawn today and your tests are completely normal, you will receive your results only by: MyChart Message (if you have MyChart) OR A paper copy in the mail If you have any lab test that is abnormal or we need to change your treatment, we will call you to review the results.  Testing/Procedures: Your physician has requested that you have an echocardiogram. Echocardiography is a painless test that uses sound waves to create images of your heart. It provides your doctor with information about the size and shape of your heart and how well your heart's chambers and valves are working.   You may receive an ultrasound enhancing agent through an IV if needed to better visualize your heart during the echo. This procedure takes approximately one hour.  There are no restrictions for this procedure.  This will take place at [address] [facility]) [address], [place] [address]  Please note: We ask at that you not bring children with you during ultrasound (echo/ vascular) testing. Due to room size and safety concerns, children are not allowed in the ultrasound rooms during exams. Our front office staff cannot provide observation of children in our lobby area while testing is being conducted. An adult accompanying a patient to their appointment will only be allowed in the ultrasound room at the discretion of the ultrasound  technician under special circumstances. We apologize for any inconvenience.   Follow-Up: At [facility], you and your health needs are our priority.  As part of our continuing mission to provide you with exceptional heart care, our providers are all part of one team.  This team includes your primary Cardiologist (physician) and Advanced Practice Providers or APPs (Physician Assistants and Nurse Practitioners) who all work together to provide you with the care you need, when you need it.  Your next appointment:   6 month(s)  Provider:   You may see Bernardino Bring, [name]  We recommend signing up for the patient portal called MyChart.  Sign up information is provided on this After Visit Summary.  MyChart is used to connect with patients for Virtual Visits (Telemedicine).  Patients are able to view lab/test results, encounter notes, upcoming appointments, etc.  Non-urgent messages can be sent to your provider as well.   To learn more about what you can do with MyChart, go to [email or web address].

## 2024-05-25 ENCOUNTER — Ambulatory Visit: Attending: Physician Assistant

## 2024-05-25 DIAGNOSIS — I351 Nonrheumatic aortic (valve) insufficiency: Secondary | ICD-10-CM

## 2024-05-25 LAB — ECHOCARDIOGRAM COMPLETE
AR max vel: 1.57 cm2
AV Area VTI: 1.54 cm2
AV Area mean vel: 1.61 cm2
AV Mean grad: 5 mmHg
AV Peak grad: 8.9 mmHg
Ao pk vel: 1.49 m/s
Area-P 1/2: 4.15 cm2
S' Lateral: 2.68 cm

## 2024-05-26 ENCOUNTER — Ambulatory Visit: Payer: Self-pay | Admitting: Physician Assistant

## 2024-05-26 DIAGNOSIS — E785 Hyperlipidemia, unspecified: Secondary | ICD-10-CM

## 2024-05-26 DIAGNOSIS — Z79899 Other long term (current) drug therapy: Secondary | ICD-10-CM

## 2024-06-05 DIAGNOSIS — E538 Deficiency of other specified B group vitamins: Secondary | ICD-10-CM | POA: Diagnosis not present

## 2024-06-22 DIAGNOSIS — H401111 Primary open-angle glaucoma, right eye, mild stage: Secondary | ICD-10-CM | POA: Diagnosis not present

## 2024-06-22 DIAGNOSIS — H401122 Primary open-angle glaucoma, left eye, moderate stage: Secondary | ICD-10-CM | POA: Diagnosis not present

## 2024-06-22 DIAGNOSIS — H52223 Regular astigmatism, bilateral: Secondary | ICD-10-CM | POA: Diagnosis not present

## 2024-06-30 DIAGNOSIS — H5213 Myopia, bilateral: Secondary | ICD-10-CM | POA: Diagnosis not present

## 2024-06-30 DIAGNOSIS — H401122 Primary open-angle glaucoma, left eye, moderate stage: Secondary | ICD-10-CM | POA: Diagnosis not present

## 2024-06-30 DIAGNOSIS — H26492 Other secondary cataract, left eye: Secondary | ICD-10-CM | POA: Diagnosis not present

## 2024-06-30 DIAGNOSIS — Z961 Presence of intraocular lens: Secondary | ICD-10-CM | POA: Diagnosis not present

## 2024-06-30 DIAGNOSIS — H401111 Primary open-angle glaucoma, right eye, mild stage: Secondary | ICD-10-CM | POA: Diagnosis not present

## 2024-07-06 ENCOUNTER — Other Ambulatory Visit: Payer: Self-pay | Admitting: Emergency Medicine

## 2024-07-06 DIAGNOSIS — E538 Deficiency of other specified B group vitamins: Secondary | ICD-10-CM | POA: Diagnosis not present

## 2024-07-06 DIAGNOSIS — E785 Hyperlipidemia, unspecified: Secondary | ICD-10-CM

## 2024-07-07 DIAGNOSIS — E785 Hyperlipidemia, unspecified: Secondary | ICD-10-CM | POA: Diagnosis not present

## 2024-07-08 LAB — LIPID PANEL
Chol/HDL Ratio: 2.3 ratio (ref 0.0–4.4)
Cholesterol, Total: 173 mg/dL (ref 100–199)
HDL: 74 mg/dL (ref >39)
LDL Chol Calc (NIH): 86 mg/dL (ref 0–99)
Triglycerides: 69 mg/dL (ref 0–149)
VLDL Cholesterol Cal: 13 mg/dL (ref 5–40)

## 2024-07-08 LAB — HEPATIC FUNCTION PANEL
ALT: 17 IU/L (ref 0–32)
AST: 18 IU/L (ref 0–40)
Albumin: 3.9 g/dL (ref 3.7–4.7)
Alkaline Phosphatase: 76 IU/L (ref 48–129)
Bilirubin Total: 0.5 mg/dL (ref 0.0–1.2)
Bilirubin, Direct: 0.17 mg/dL (ref 0.00–0.40)
Total Protein: 6.4 g/dL (ref 6.0–8.5)

## 2024-07-09 MED ORDER — ATORVASTATIN CALCIUM 80 MG PO TABS: 80.0000 mg | ORAL_TABLET | Freq: Every day | ORAL | 3 refills | Status: DC

## 2024-08-06 DIAGNOSIS — E538 Deficiency of other specified B group vitamins: Secondary | ICD-10-CM | POA: Diagnosis not present

## 2024-08-14 ENCOUNTER — Other Ambulatory Visit: Payer: Self-pay | Admitting: Internal Medicine

## 2024-08-14 DIAGNOSIS — Z1231 Encounter for screening mammogram for malignant neoplasm of breast: Secondary | ICD-10-CM

## 2024-10-08 ENCOUNTER — Other Ambulatory Visit: Payer: Self-pay

## 2024-10-08 DIAGNOSIS — Z79899 Other long term (current) drug therapy: Secondary | ICD-10-CM

## 2024-10-08 DIAGNOSIS — E785 Hyperlipidemia, unspecified: Secondary | ICD-10-CM

## 2024-10-09 ENCOUNTER — Ambulatory Visit: Payer: Self-pay | Admitting: Physician Assistant

## 2024-10-09 ENCOUNTER — Other Ambulatory Visit: Payer: Self-pay | Admitting: Emergency Medicine

## 2024-10-09 DIAGNOSIS — Z79899 Other long term (current) drug therapy: Secondary | ICD-10-CM

## 2024-10-09 DIAGNOSIS — E785 Hyperlipidemia, unspecified: Secondary | ICD-10-CM

## 2024-10-09 LAB — AST: AST: 28 [IU]/L (ref 0–40)

## 2024-10-09 LAB — LIPID PANEL
Chol/HDL Ratio: 2.6 ratio (ref 0.0–4.4)
Cholesterol, Total: 166 mg/dL (ref 100–199)
HDL: 63 mg/dL
LDL Chol Calc (NIH): 88 mg/dL (ref 0–99)
Triglycerides: 83 mg/dL (ref 0–149)
VLDL Cholesterol Cal: 15 mg/dL (ref 5–40)

## 2024-10-09 LAB — ALT: ALT: 20 [IU]/L (ref 0–32)

## 2024-10-09 MED ORDER — EZETIMIBE 10 MG PO TABS: 10.0000 mg | ORAL_TABLET | Freq: Every day | ORAL | 3 refills | Status: AC

## 2024-10-09 MED ORDER — ATORVASTATIN CALCIUM 40 MG PO TABS: 40.0000 mg | ORAL_TABLET | Freq: Every day | ORAL | 3 refills | Status: AC
